# Patient Record
Sex: Female | Born: 1955
Health system: Southern US, Community
[De-identification: ages and names within clinical notes are randomized; demographics above are authoritative.]

## PROBLEM LIST (undated history)

## (undated) DIAGNOSIS — M2550 Pain in unspecified joint: Secondary | ICD-10-CM

## (undated) DIAGNOSIS — E739 Lactose intolerance, unspecified: Secondary | ICD-10-CM

## (undated) DIAGNOSIS — E7800 Pure hypercholesterolemia, unspecified: Secondary | ICD-10-CM

## (undated) DIAGNOSIS — R0602 Shortness of breath: Secondary | ICD-10-CM

## (undated) DIAGNOSIS — G43909 Migraine, unspecified, not intractable, without status migrainosus: Secondary | ICD-10-CM

## (undated) DIAGNOSIS — N3281 Overactive bladder: Secondary | ICD-10-CM

## (undated) DIAGNOSIS — T7840XA Allergy, unspecified, initial encounter: Secondary | ICD-10-CM

## (undated) DIAGNOSIS — R3129 Other microscopic hematuria: Secondary | ICD-10-CM

## (undated) HISTORY — DX: Migraine, unspecified, not intractable, without status migrainosus: G43.909

## (undated) HISTORY — DX: Shortness of breath: R06.02

## (undated) HISTORY — PX: EYE SURGERY: SHX253

## (undated) HISTORY — DX: Lactose intolerance, unspecified: E73.9

## (undated) HISTORY — DX: Pure hypercholesterolemia, unspecified: E78.00

## (undated) HISTORY — DX: Other microscopic hematuria: R31.29

## (undated) HISTORY — DX: Pain in unspecified joint: M25.50

## (undated) HISTORY — DX: Allergy, unspecified, initial encounter: T78.40XA

## (undated) HISTORY — DX: Overactive bladder: N32.81

---

## 1999-02-06 ENCOUNTER — Other Ambulatory Visit: Admission: RE | Admit: 1999-02-06 | Discharge: 1999-02-06 | Payer: Self-pay | Admitting: Gynecology

## 2000-02-12 ENCOUNTER — Other Ambulatory Visit: Admission: RE | Admit: 2000-02-12 | Discharge: 2000-02-12 | Payer: Self-pay | Admitting: Gynecology

## 2001-04-05 ENCOUNTER — Other Ambulatory Visit: Admission: RE | Admit: 2001-04-05 | Discharge: 2001-04-05 | Payer: Self-pay | Admitting: Gynecology

## 2001-05-06 ENCOUNTER — Ambulatory Visit (HOSPITAL_COMMUNITY): Admission: RE | Admit: 2001-05-06 | Discharge: 2001-05-06 | Payer: Self-pay | Admitting: Gynecology

## 2002-03-22 ENCOUNTER — Other Ambulatory Visit: Admission: RE | Admit: 2002-03-22 | Discharge: 2002-03-22 | Payer: Self-pay | Admitting: Gynecology

## 2003-04-02 ENCOUNTER — Other Ambulatory Visit: Admission: RE | Admit: 2003-04-02 | Discharge: 2003-04-02 | Payer: Self-pay | Admitting: Gynecology

## 2004-05-19 ENCOUNTER — Other Ambulatory Visit: Admission: RE | Admit: 2004-05-19 | Discharge: 2004-05-19 | Payer: Self-pay | Admitting: Gynecology

## 2005-05-25 ENCOUNTER — Other Ambulatory Visit: Admission: RE | Admit: 2005-05-25 | Discharge: 2005-05-25 | Payer: Self-pay | Admitting: Obstetrics and Gynecology

## 2007-12-26 ENCOUNTER — Ambulatory Visit: Payer: Self-pay | Admitting: Internal Medicine

## 2008-04-17 ENCOUNTER — Ambulatory Visit: Payer: Self-pay | Admitting: Internal Medicine

## 2008-04-20 ENCOUNTER — Encounter: Admission: RE | Admit: 2008-04-20 | Discharge: 2008-04-20 | Payer: Self-pay | Admitting: Internal Medicine

## 2009-01-08 ENCOUNTER — Ambulatory Visit: Payer: Self-pay | Admitting: Internal Medicine

## 2009-04-02 ENCOUNTER — Ambulatory Visit: Payer: Self-pay | Admitting: Internal Medicine

## 2009-04-08 ENCOUNTER — Encounter: Admission: RE | Admit: 2009-04-08 | Discharge: 2009-04-08 | Payer: Self-pay | Admitting: Internal Medicine

## 2009-04-16 ENCOUNTER — Ambulatory Visit: Payer: Self-pay | Admitting: Internal Medicine

## 2009-10-22 ENCOUNTER — Ambulatory Visit: Payer: Self-pay | Admitting: Internal Medicine

## 2009-10-24 ENCOUNTER — Encounter: Admission: RE | Admit: 2009-10-24 | Discharge: 2009-10-24 | Payer: Self-pay | Admitting: Internal Medicine

## 2009-11-18 ENCOUNTER — Ambulatory Visit: Payer: Self-pay | Admitting: Internal Medicine

## 2010-03-23 ENCOUNTER — Encounter: Payer: Self-pay | Admitting: Internal Medicine

## 2010-07-18 NOTE — Op Note (Signed)
[facility]  Patient:    Brandi Berry, Brandi Berry Visit Number: [id number] MRN: [id number]          Service Type: DSU Location: [facility] Attending Physician:  Susa Raring Dictated by:   Luvenia Redden, M.D. Proc. Date: 05/06/01 Admit Date:  05/06/2001                             Operative Report  PREOPERATIVE DIAGNOSIS:       Abnormal uterine bleeding unresponsive to conservative therapy.  POSTOPERATIVE DIAGNOSES:      1. Abnormal uterine bleeding unresponsive to                                  conservative therapy.                               2. Endometrial polyps.  OPERATION:                    Hysteroscopy, dilation and curettage.  SURGEON:                      Luvenia Redden, M.D.  DESCRIPTION OF PROCEDURE:     Under good sedation, the patient was prepped and draped in a sterile manner.  The bladder was catheterized.  The uterus was retroflexed and upper limits of normal in size.  No palpable adnexal masses were present.  The cervix was grasped with a tenaculum and a paracervical block was performed using 10 cc of 1% lidocaine injected in the 4 and 8 oclock positions paracervically.  The uterus was then sounded to a depth of 9 cm.  The cervix was dilated.  A hysteroscope was placed in the endocervical canal.  Using Sorbitol as a distending medium, the canal was examined.  The scope was advanced into the uterine cavity.  In the uterine cavity were multiple polypoid excrescences from the endometrial lining.  There was one small synechia present in the midline going anterior to posteriorly.  The ostia were viewed and appeared normal.  The fundus appeared normal.  In the lining, most of the polyps were down toward the lower uterine segment anteriorly and posteriorly.  The scope was then retracted.  First, an endocervical curettage was done.  This was sent as a separate specimen.  The endometrial cavity was then explored with polyp forceps  and multiple fragments of tissue were obtained.  The endometrial cavity was then sharply curetted and a small to moderate amount of tissue was obtained.  The endometrial cavity was then wiped with a dry sponge and it was clean.  The scope was reinserted and the endometrial cavity was inspected.  The synechia that was present had been divided.  The endometrium had a shaggy, filmy appearance.  All of the polyps were gone.  The scope was then retracted and the tenaculum removed.  There was no excess bleeding.  Blood loss was estimated at 25 cc.  There was minimal fluid deficit.  The patient tolerated the procedure nicely and was removed to recovery in good condition. Dictated by:   Luvenia Redden, M.D. Attending Physician:  Susa Raring DD:  05/06/01 TD:  05/08/01 Job: [id number]

## 2010-09-11 ENCOUNTER — Other Ambulatory Visit: Payer: Self-pay | Admitting: Gynecology

## 2010-09-29 ENCOUNTER — Ambulatory Visit (INDEPENDENT_AMBULATORY_CARE_PROVIDER_SITE_OTHER): Payer: Self-pay | Admitting: Internal Medicine

## 2010-09-29 ENCOUNTER — Encounter: Payer: Self-pay | Admitting: Internal Medicine

## 2010-09-29 VITALS — BP 122/82 | HR 72 | Temp 98.0°F | Ht 65.0 in | Wt 182.0 lb

## 2010-09-29 DIAGNOSIS — G43909 Migraine, unspecified, not intractable, without status migrainosus: Secondary | ICD-10-CM | POA: Insufficient documentation

## 2010-09-29 NOTE — Progress Notes (Signed)
  Subjective:    Patient ID: Brandi Berry, female    DOB: 02-Jul-1955, 55 y.o.   MRN: [id number]  HPI patient has a history of migraine headaches. She used to be able to take Excedrin Migraine with some relief but that has not been available over-the-counter lately. Hasn't had a bad migraine in about 6 months. Developed a mild headache on Saturday, July 28. She failed one leftover Excedrin Migraine tablet in took it with some relief but continued to have a dull headache the rest of the day. The next day awakened with a headache and progressed to nausea. Try taking Demerol tablet I  had given her in the past which she says made headache worse. Still has headache today. She has tried Imitrex previously with no relief. Says Vicodin makes headache worse. I have prescribed Flexeril in the past but she said it really didn't help.    Review of Systems     Objective:   Physical Exam funduscopic exam is benign bilaterally. Cranial nerves II through XII are grossly intact. She is alert and oriented x3. Deep tendon reflexes 2+ and symmetrical in muscle strength is 5/5 in all groups tested. No focal deficits on brief neurological exam        Assessment & Plan:   Protracted migraine headache-treat with Sterapred DS 10 mg 6 day dosepak. Samples of Relpax and prescription with [facility] in one year refill. She should try Relpax within an hour of onset of headache. The problem is that sometimes she doesn't realize that headache is really a migraine and not just a tension headache. She should not wait too long to take action. First, she may try a Goody powder. If no relief within an hour, she is to try a Relpax 40 mg tablet. If no relief within a couple of hours, should try 20 mg prednisone orally. I have also given her a prescription today for Stadol nasal spray to use 1 spray in nostril should headache not improve despite these measures. Told her this could cause drowsiness. Hopefully we'll not make headache  worse as Vicodin and Demerol did. Says she's not having these headaches very frequently I don't think she is a candidate for Topamax. Also for nausea gave her Phenergan tablets 25 mg #30 one by mouth every 4 hours when necessary nausea. She complains this causes extreme drowsiness. If headaches do not improve, refer to neurologist

## 2011-02-02 ENCOUNTER — Telehealth: Payer: Self-pay | Admitting: Internal Medicine

## 2011-02-02 NOTE — Telephone Encounter (Signed)
Please have patient book visit.

## 2011-02-02 NOTE — Telephone Encounter (Signed)
Called patient and left voicemail that she needed to schedule an appt to be seen for her migraines per Dr. Lenord Fellers.

## 2011-03-10 ENCOUNTER — Encounter: Payer: Self-pay | Admitting: Internal Medicine

## 2011-03-10 ENCOUNTER — Ambulatory Visit (INDEPENDENT_AMBULATORY_CARE_PROVIDER_SITE_OTHER): Payer: 59 | Admitting: Internal Medicine

## 2011-03-10 VITALS — BP 112/78 | HR 60 | Temp 98.4°F | Wt 182.0 lb

## 2011-03-10 DIAGNOSIS — H6690 Otitis media, unspecified, unspecified ear: Secondary | ICD-10-CM

## 2011-03-10 DIAGNOSIS — H6693 Otitis media, unspecified, bilateral: Secondary | ICD-10-CM

## 2011-03-10 DIAGNOSIS — Z8669 Personal history of other diseases of the nervous system and sense organs: Secondary | ICD-10-CM

## 2011-03-10 DIAGNOSIS — J069 Acute upper respiratory infection, unspecified: Secondary | ICD-10-CM

## 2011-03-11 NOTE — Patient Instructions (Signed)
Take medications as directed for respiratory infection. For migraine headache, take triptan medication or over-the-counter medication immediately at onset of migraine. If no relief, may take 3 or 4--10 mg prednisone tablets one time only. Patient does not tolerate hydrocodone very well.

## 2011-03-11 NOTE — Progress Notes (Signed)
  Subjective:    Patient ID: Brandi Berry, female    DOB: 1955/06/16, 56 y.o.   MRN: [id number]  HPI 56 year old black female with history of migraine headaches in today with 2 week history of URI symptoms. Has had coughing congestion. Ears feel stopped up. No fever or shaking chills. No myalgias. Migraines have been better recently. Responds well to Sterapred but advised her she cannot take this on a regular basis. Doesn't tolerate hydrocodone well. Have given her triptan  medication which she says "helps some".    Review of Systems     Objective:   Physical Exam HEENT exam: Both TMs are full and pain bilaterally; pharynx is slightly injected without exudate; neck is supple without significant adenopathy; chest clear to auscultation.        Assessment & Plan:  Bilateral otitis media  URI  History of migraine headaches  Plan patient was given a Sterapred DS 10 mg 6 day dosepak of 21 tablets. If she gets a migraine headache that does not respond to tryptophan medication or over-the-counter medication, she may take 3 or 4 tablets of prednisone once.  Given Levaquin 500 milligrams daily for 10 days for URI and otitis media.

## 2011-05-19 ENCOUNTER — Encounter: Payer: Self-pay | Admitting: Internal Medicine

## 2011-05-19 ENCOUNTER — Ambulatory Visit (INDEPENDENT_AMBULATORY_CARE_PROVIDER_SITE_OTHER): Payer: 59 | Admitting: Internal Medicine

## 2011-05-19 VITALS — BP 116/80 | HR 80 | Temp 98.2°F | Wt 190.0 lb

## 2011-05-19 DIAGNOSIS — H9209 Otalgia, unspecified ear: Secondary | ICD-10-CM

## 2011-05-19 DIAGNOSIS — G43909 Migraine, unspecified, not intractable, without status migrainosus: Secondary | ICD-10-CM

## 2011-05-19 DIAGNOSIS — Z1211 Encounter for screening for malignant neoplasm of colon: Secondary | ICD-10-CM

## 2011-05-19 DIAGNOSIS — H9201 Otalgia, right ear: Secondary | ICD-10-CM

## 2011-05-19 NOTE — Progress Notes (Signed)
Addended by: Judy Pimple on: 05/19/2011 12:45 PM   Modules accepted: Orders

## 2011-05-19 NOTE — Progress Notes (Signed)
  Subjective:    Patient ID: Brandi Berry, female    DOB: March 30, 1955, 56 y.o.   MRN: [id number]  HPI patient says that husband and son had URI symptoms. She feels pain in her right year deep in her canal. Says that the pain is somewhat sharp. Says it radiates down into her neck and shoulder causing a headache. This was particularly true on Saturday, March 16. Has a history of migraine headaches. Has tried many remedies but not a lot works for her. We've tried Flexeril at bedtime as a preventative, Topamax she says made her headaches worse. Steroids work well to knock out the headache after it's been present for several hours or days. However she can't stay on that. Says that ear feels stopped up and she can't hear as well. We performed audiometry today and hearing was normal in the right ear at 40 dB and she only missed one tone 1000 Hz at 25 dB. Reviewed with her foods that would cause migraine headache. Generally when she awakens with a headache she tries to take 2 Excedrin migraine. Sometimes waits too long before trying to take action to treat the pain and wants up feeling nauseated. She does have Phenergan tablets to take for nausea. Doesn't have migraine as frequently as she use to. Has tried Relpax sample in the past and I gave her and it did work. Is asking for prescription for Relpax sample. Has never tried imipramine to prevent migraine. Is willing to try that. She is out of steroid dosepak.   Patient is pretty adamant that her right ear feels stopped up to some extent. She does sleep on her right side and says that she awakened on Saturday, March 16 with pain in right ear that extended into her neck and shoulder. I wonder if this is positional pain. Denies TMJ symptoms. Doesn't think she grinds her teeth at night.    Review of Systems     Objective:   Physical Exam both TMs look okay to me. They are not dull. There is a small amount of wax in the right external canal but it is not  blocking the external canal completely. Right external canal shows no lesions. Pharynx is clear. PERRLA extraocular movements are full.        Assessment & Plan:  Long discussion with patient some 25 minutes about how to manage migraine headaches and possible causes for ear pain. Prescribed Relpax tablets one months supply with refills. Gave her another Sterapred DS 10 mg 6 day dosepak. This is for prolonged protracted headache that does not resolve with Excedrin migraine or Relpax. Doesn't do well with pain medication such as Vicodin or butalbital. Try Elavil 10 mg at bedtime #30 with 2 refills as a prophylactic for migraine. Perhaps has some eustachian tube dysfunction. Had her try to blow with nostrils pH but she said that did not help clear her ear. Try Levaquin 500 milligrams daily for 7 days in case there is some middle ear infection. If ear symptoms do not resolve, we will refer to ENT physician for ear evaluation.  Diagnoses: right otalgia   Migraine headaches  Addendum: Patient wants to have screening colonoscopy in the near future.

## 2011-05-19 NOTE — Patient Instructions (Signed)
Take Levaquin for 7 days for ear congestion. Try Elavil 10 mg at bedtime to prevent migraine headaches. He had been prescribed Relpax tablets to take at onset of migraine. You have been prescribed 1 Sterapred DS 10 mg 6 day dosepak to take if you get into a cycle of prolonged headache.  Call call us back and request ENT referral if ear pain does not resolve.  We will try to schedule

## 2011-07-29 ENCOUNTER — Encounter: Payer: Self-pay | Admitting: Gastroenterology

## 2012-08-30 ENCOUNTER — Ambulatory Visit (INDEPENDENT_AMBULATORY_CARE_PROVIDER_SITE_OTHER): Payer: 59 | Admitting: Internal Medicine

## 2012-08-30 ENCOUNTER — Encounter: Payer: Self-pay | Admitting: Internal Medicine

## 2012-08-30 VITALS — BP 126/84 | HR 80 | Temp 97.8°F | Wt 193.0 lb

## 2012-08-30 DIAGNOSIS — Z8669 Personal history of other diseases of the nervous system and sense organs: Secondary | ICD-10-CM

## 2012-08-30 DIAGNOSIS — J019 Acute sinusitis, unspecified: Secondary | ICD-10-CM

## 2012-08-30 NOTE — Progress Notes (Signed)
  Subjective:    Patient ID: Brandi Berry, female    DOB: 1955/10/27, 57 y.o.   MRN: [id number]  HPI 57 year old Black female works 2 jobs. Long-standing history of migraine headaches. Currently having 3 or 4 migraines a week. Sometimes takes Excedrin and waits for headache to go away. Sometimes gets into a full-blown migraine and doesn't seem to recognize it before its to leg. Recently I gave her some Relpax to try. It does help some but of concern it may be contributing to recurrent headaches. She says she's had sinus issues recently. Has had nasal stuffiness. Not able to get much nasal drainage and out. Tenderness in maxillary sinuses.  Have reviewed with her previously foods that can cause headaches. She's been under some stress because husband had knee replacement surgery, wound up with a fractured patella after a fall and was hospitalized with Dilantin toxicity from a rehabilitation facility.  Have spoken with her about how to manage headaches in the past but I think she needs additional evaluation and help.    Review of Systems     Objective:   Physical Exam fundi benign. TMs are clear. Pharynx is clear. Neck is supple without thyromegaly JVD or carotid bruits. Chest clear. Cardiac exam regular rate and rhythm normal S1 and S2. Neurological exam: Cranial nerves II through XII intact. No facial asymmetry. Extraocular movements are full. PERRLA. Muscle strength is normal. Deep tendon reflexes 2+ and symmetrical. Alert and oriented x3.        Assessment & Plan:  Frequent migraine headaches could be exacerbated by frequent use of triptan  medication.  Sinusitis  Plan: Sterapred DS 10 mg 6 day dosepak to help get her out of headache cycle. Zomig 5 mg nasal spray to use for onset of severe migraine. Appointment with Dr. Neale Burly regarding headaches. Levaquin 500 milligrams daily for 7 days for sinusitis.

## 2012-09-03 NOTE — Patient Instructions (Addendum)
Take antibiotics as directed for sinusitis. Take Sterapred DS 10 mg 6 day dosepak to get out of headache cycle. Appointment will be made for you to see Dr. Neale Burly

## 2013-05-25 ENCOUNTER — Encounter: Payer: Self-pay | Admitting: Internal Medicine

## 2013-05-25 ENCOUNTER — Ambulatory Visit (INDEPENDENT_AMBULATORY_CARE_PROVIDER_SITE_OTHER): Payer: 59 | Admitting: Internal Medicine

## 2013-05-25 VITALS — BP 118/82 | HR 84 | Temp 98.4°F | Wt 193.0 lb

## 2013-05-25 DIAGNOSIS — G43909 Migraine, unspecified, not intractable, without status migrainosus: Secondary | ICD-10-CM

## 2013-05-25 DIAGNOSIS — H6590 Unspecified nonsuppurative otitis media, unspecified ear: Secondary | ICD-10-CM

## 2013-05-25 DIAGNOSIS — H6593 Unspecified nonsuppurative otitis media, bilateral: Secondary | ICD-10-CM

## 2013-05-25 DIAGNOSIS — J029 Acute pharyngitis, unspecified: Secondary | ICD-10-CM

## 2013-05-25 DIAGNOSIS — J069 Acute upper respiratory infection, unspecified: Secondary | ICD-10-CM

## 2013-05-25 LAB — POCT RAPID STREP A (OFFICE): RAPID STREP A SCREEN: NEGATIVE

## 2013-05-25 MED ORDER — LEVOFLOXACIN 500 MG PO TABS: 500.0000 mg | ORAL_TABLET | Freq: Every day | ORAL | Status: DC

## 2013-05-25 MED ORDER — BUTALBITAL-APAP-CAFFEINE 50-325-40 MG PO TABS
ORAL_TABLET | ORAL | Status: DC
Start: 2013-05-25 — End: 2015-02-01

## 2013-05-25 MED ORDER — BENZONATATE 100 MG PO CAPS: 200.0000 mg | ORAL_CAPSULE | Freq: Three times a day (TID) | ORAL | Status: DC

## 2013-05-25 MED ORDER — PREDNISONE 10 MG PO KIT: PACK | ORAL | Status: DC

## 2013-05-25 NOTE — Addendum Note (Signed)
Addended by: Brett Canales on: 05/25/2013 11:26 AM   Modules accepted: Orders

## 2013-05-25 NOTE — Progress Notes (Signed)
   Subjective:    Patient ID: Brandi Berry, female    DOB: [birth year], 58 y.o.   MRN: [id number]  HPI  In today with URI symptoms. Has had cough and congestion. Some discolored nasal drainage. Has had migraine headache as well. No fever or shaking chills. Some sore throat. [facility] for recurrent migraine headaches. Initially it seemed to help but more recently when dose was increased she said it did not. She needs to go back and see Dr. Domingo Cocking once again.    Review of Systems     Objective:   Physical Exam rapid strep screen is negative. Pharynx very slightly injected. TMs are dull bilaterally but not red. Neck is supple. Chest clear.        Assessment & Plan:  Migraine headache  Acute upper respiratory infection  Bilateral serous otitis media  Plan: Sterapred DS 10 mg 6 day dosepak take as directed in tapering course over 6 days. Levaquin 500 milligrams daily for 10 days. Fioricet 1 or 2 by mouth Q8 hours when necessary headache not to exceed 6 tablets in 24 hours. She cannot tolerate hydrocodone. Tessalon Perles 200 mg 3 times a day when necessary cough.

## 2013-05-25 NOTE — Patient Instructions (Signed)
Take Fioricet as needed for headache. Take Levaquin 500 milligrams daily for respiratory infection symptoms including ear infection. Tessalon Perles as needed for cough. Prednisone taper as needed for congestion and headache.

## 2013-06-22 ENCOUNTER — Telehealth: Payer: Self-pay | Admitting: Internal Medicine

## 2013-06-22 NOTE — Telephone Encounter (Signed)
Noted  

## 2013-06-23 ENCOUNTER — Ambulatory Visit (INDEPENDENT_AMBULATORY_CARE_PROVIDER_SITE_OTHER): Payer: 59 | Admitting: Internal Medicine

## 2013-06-23 ENCOUNTER — Encounter: Payer: Self-pay | Admitting: Internal Medicine

## 2013-06-23 VITALS — BP 124/82 | HR 72 | Temp 98.2°F | Wt 193.0 lb

## 2013-06-23 DIAGNOSIS — J309 Allergic rhinitis, unspecified: Secondary | ICD-10-CM

## 2013-06-23 DIAGNOSIS — L299 Pruritus, unspecified: Secondary | ICD-10-CM

## 2013-06-23 MED ORDER — PREDNISONE 10 MG PO TABS: 10.0000 mg | ORAL_TABLET | Freq: Every day | ORAL | Status: DC

## 2013-06-23 MED ORDER — AZITHROMYCIN 250 MG PO TABS: ORAL_TABLET | ORAL | Status: DC

## 2013-06-23 NOTE — Progress Notes (Signed)
   Subjective:    Patient ID: Brandi Berry, female    DOB: 10/29/1955, 58 y.o.   MRN: [id number]  HPI Patient was seen March 26 for respiratory infection and migraine headache treated with Levaquin, six-day taper her prednisone, Fioricet for migraine headache. She says she is no better. Says that her ears feel stopped up, her head feels full, and she feels congested. She tried Flonase nasal spray but she says I gave her a headache. She does not remember taking prednisone taper just the antibiotic. Does not have headache today.    Review of Systems     Objective:   Physical Exam both TMs are dull bilaterally. Pharynx is clear. Neck is supple. Chest clear.        Assessment & Plan:  Allergic rhinitis  Plan: Levaquin 500 milligrams daily for 7 days. Sterapred DS 10 mg 12 day dosepak take as directed. Tells me that she takes Zyrtec on a daily basis for skin itching.  Addendum: Pharmacy confirms that they did not get prescription for six-day Sterapred DS pack on March 26.

## 2013-06-23 NOTE — Patient Instructions (Signed)
Take Zithromax Z-PAK as directed and prednisone 12 day taper as directed.

## 2013-07-08 ENCOUNTER — Ambulatory Visit: Payer: 59

## 2013-07-08 ENCOUNTER — Other Ambulatory Visit: Payer: Self-pay | Admitting: Family Medicine

## 2013-07-08 ENCOUNTER — Ambulatory Visit (INDEPENDENT_AMBULATORY_CARE_PROVIDER_SITE_OTHER): Payer: 59 | Admitting: Family Medicine

## 2013-07-08 VITALS — BP 116/64 | HR 64 | Temp 98.4°F | Resp 12 | Ht 63.4 in | Wt 185.4 lb

## 2013-07-08 DIAGNOSIS — M79672 Pain in left foot: Secondary | ICD-10-CM

## 2013-07-08 DIAGNOSIS — M79609 Pain in unspecified limb: Secondary | ICD-10-CM

## 2013-07-08 DIAGNOSIS — M722 Plantar fascial fibromatosis: Secondary | ICD-10-CM

## 2013-07-08 DIAGNOSIS — M7742 Metatarsalgia, left foot: Secondary | ICD-10-CM

## 2013-07-08 MED ORDER — MELOXICAM 15 MG PO TABS: 15.0000 mg | ORAL_TABLET | Freq: Every day | ORAL | Status: DC

## 2013-07-08 MED ORDER — TRAMADOL HCL 50 MG PO TABS: 50.0000 mg | ORAL_TABLET | Freq: Four times a day (QID) | ORAL | Status: DC | PRN

## 2013-07-08 NOTE — Patient Instructions (Signed)
Plantar Fasciitis (Heel Spur Syndrome)  with Rehab  The plantar fascia is a fibrous, ligament-like, soft-tissue structure that spans the bottom of the foot. Plantar fasciitis is a condition that causes pain in the foot due to inflammation of the tissue.  SYMPTOMS   · Pain and tenderness on the underneath side of the foot.  · Pain that worsens with standing or walking.  CAUSES   Plantar fasciitis is caused by irritation and injury to the plantar fascia on the underneath side of the foot. Common mechanisms of injury include:  · Direct trauma to bottom of the foot.  · Damage to a small nerve that runs under the foot where the main fascia attaches to the heel bone.  · Stress placed on the plantar fascia due to bone spurs.  RISK INCREASES WITH:   · Activities that place stress on the plantar fascia (running, jumping, pivoting, or cutting).  · Poor strength and flexibility.  · Improperly fitted shoes.  · Tight calf muscles.  · Flat feet.  · Failure to warm-up properly before activity.  · Obesity.  PREVENTION  · Warm up and stretch properly before activity.  · Allow for adequate recovery between workouts.  · Maintain physical fitness:  · Strength, flexibility, and endurance.  · Cardiovascular fitness.  · Maintain a health body weight.  · Avoid stress on the plantar fascia.  · Wear properly fitted shoes, including arch supports for individuals who have flat feet.  PROGNOSIS   If treated properly, then the symptoms of plantar fasciitis usually resolve without surgery. However, occasionally surgery is necessary.  RELATED COMPLICATIONS   · Recurrent symptoms that may result in a chronic condition.  · Problems of the lower back that are caused by compensating for the injury, such as limping.  · Pain or weakness of the foot during push-off following surgery.  · Chronic inflammation, scarring, and partial or complete fascia tear, occurring more often from repeated injections.  TREATMENT   Treatment initially involves the use of  ice and medication to help reduce pain and inflammation. The use of strengthening and stretching exercises may help reduce pain with activity, especially stretches of the Achilles tendon. These exercises may be performed at home or with a therapist. Your caregiver may recommend that you use heel cups of arch supports to help reduce stress on the plantar fascia. Occasionally, corticosteroid injections are given to reduce inflammation. If symptoms persist for greater than 6 months despite non-surgical (conservative), then surgery may be recommended.   MEDICATION   · If pain medication is necessary, then nonsteroidal anti-inflammatory medications, such as aspirin and ibuprofen, or other minor pain relievers, such as acetaminophen, are often recommended.  · Do not take pain medication within 7 days before surgery.  · Prescription pain relievers may be given if deemed necessary by your caregiver. Use only as directed and only as much as you need.  · Corticosteroid injections may be given by your caregiver. These injections should be reserved for the most serious cases, because they may only be given a certain number of times.  HEAT AND COLD  · Cold treatment (icing) relieves pain and reduces inflammation. Cold treatment should be applied for 10 to 15 minutes every 2 to 3 hours for inflammation and pain and immediately after any activity that aggravates your symptoms. Use ice packs or massage the area with a piece of ice (ice massage).  · Heat treatment may be used prior to performing the stretching and strengthening activities prescribed   by your caregiver, physical therapist, or athletic trainer. Use a heat pack or soak the injury in warm water.  SEEK IMMEDIATE MEDICAL CARE IF:  · Treatment seems to offer no benefit, or the condition worsens.  · Any medications produce adverse side effects.  EXERCISES  RANGE OF MOTION (ROM) AND STRETCHING EXERCISES - Plantar Fasciitis (Heel Spur Syndrome)  These exercises may help you  when beginning to rehabilitate your injury. Your symptoms may resolve with or without further involvement from your physician, physical therapist or athletic trainer. While completing these exercises, remember:   · Restoring tissue flexibility helps normal motion to return to the joints. This allows healthier, less painful movement and activity.  · An effective stretch should be held for at least 30 seconds.  · A stretch should never be painful. You should only feel a gentle lengthening or release in the stretched tissue.  RANGE OF MOTION - Toe Extension, Flexion  · Sit with your right / left leg crossed over your opposite knee.  · Grasp your toes and gently pull them back toward the top of your foot. You should feel a stretch on the bottom of your toes and/or foot.  · Hold this stretch for __________ seconds.  · Now, gently pull your toes toward the bottom of your foot. You should feel a stretch on the top of your toes and or foot.  · Hold this stretch for __________ seconds.  Repeat __________ times. Complete this stretch __________ times per day.   RANGE OF MOTION - Ankle Dorsiflexion, Active Assisted  · Remove shoes and sit on a chair that is preferably not on a carpeted surface.  · Place right / left foot under knee. Extend your opposite leg for support.  · Keeping your heel down, slide your right / left foot back toward the chair until you feel a stretch at your ankle or calf. If you do not feel a stretch, slide your bottom forward to the edge of the chair, while still keeping your heel down.  · Hold this stretch for __________ seconds.  Repeat __________ times. Complete this stretch __________ times per day.   STRETCH  Gastroc, Standing  · Place hands on wall.  · Extend right / left leg, keeping the front knee somewhat bent.  · Slightly point your toes inward on your back foot.  · Keeping your right / left heel on the floor and your knee straight, shift your weight toward the wall, not allowing your back to  arch.  · You should feel a gentle stretch in the right / left calf. Hold this position for __________ seconds.  Repeat __________ times. Complete this stretch __________ times per day.  STRETCH  Soleus, Standing  · Place hands on wall.  · Extend right / left leg, keeping the other knee somewhat bent.  · Slightly point your toes inward on your back foot.  · Keep your right / left heel on the floor, bend your back knee, and slightly shift your weight over the back leg so that you feel a gentle stretch deep in your back calf.  · Hold this position for __________ seconds.  Repeat __________ times. Complete this stretch __________ times per day.  STRETCH  Gastrocsoleus, Standing   Note: This exercise can place a lot of stress on your foot and ankle. Please complete this exercise only if specifically instructed by your caregiver.   · Place the ball of your right / left foot on a step, keeping   your other foot firmly on the same step.  · Hold on to the wall or a rail for balance.  · Slowly lift your other foot, allowing your body weight to press your heel down over the edge of the step.  · You should feel a stretch in your right / left calf.  · Hold this position for __________ seconds.  · Repeat this exercise with a slight bend in your right / left knee.  Repeat __________ times. Complete this stretch __________ times per day.   STRENGTHENING EXERCISES - Plantar Fasciitis (Heel Spur Syndrome)   These exercises may help you when beginning to rehabilitate your injury. They may resolve your symptoms with or without further involvement from your physician, physical therapist or athletic trainer. While completing these exercises, remember:   · Muscles can gain both the endurance and the strength needed for everyday activities through controlled exercises.  · Complete these exercises as instructed by your physician, physical therapist or athletic trainer. Progress the resistance and repetitions only as guided.  STRENGTH - Towel  Curls  · Sit in a chair positioned on a non-carpeted surface.  · Place your foot on a towel, keeping your heel on the floor.  · Pull the towel toward your heel by only curling your toes. Keep your heel on the floor.  · If instructed by your physician, physical therapist or athletic trainer, add ____________________ at the end of the towel.  Repeat __________ times. Complete this exercise __________ times per day.  STRENGTH - Ankle Inversion  · Secure one end of a rubber exercise band/tubing to a fixed object (table, pole). Loop the other end around your foot just before your toes.  · Place your fists between your knees. This will focus your strengthening at your ankle.  · Slowly, pull your big toe up and in, making sure the band/tubing is positioned to resist the entire motion.  · Hold this position for __________ seconds.  · Have your muscles resist the band/tubing as it slowly pulls your foot back to the starting position.  Repeat __________ times. Complete this exercises __________ times per day.   Document Released: 02/16/2005 Document Revised: 05/11/2011 Document Reviewed: 05/31/2008  [employer]® Patient Information ©2014 [employer].

## 2013-07-08 NOTE — Progress Notes (Addendum)
Subjective:    Patient ID: Brandi Berry, female    DOB: Sep 16, 1955, 58 y.o.   MRN: [id number]  07/08/2013  Foot Pain  This chart was scribed for Reginia Forts, MD by Erling Conte, ED Scribe. This patient was seen in Room 14 and the patient's care was started at 11:24 AM.  HPI HPI Comments: Brandi Berry is a 58 y.o. female who presents to the [facility] complaining of intermittent left foot pain onset for a week. Patient states yesterday the pain became severe. Patient reports that the pain radiates from her ankle to the base of foot. Patient denies any injury that she knows of. Patient states that she has some associated heal pain, muscle tightness, and numbness along distal foot. Patient states that the pain is relieved with rest but reports that any kind of movement or pressure on the foot makes it worse. Patient states that she has tried Excedrin and applied heat with no relief. Patient has a history of Plantar Fascitis and was previously seen at [facility] for this problem. Patient denies any swelling.   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  Review of Systems  Constitutional: Negative for fever, chills, diaphoresis and fatigue.  Musculoskeletal: Positive for arthralgias and myalgias. Negative for joint swelling.       Left foot and heal pain  Skin: Negative for color change and rash.  Neurological: Positive for numbness (in foot).    Past Medical History  Diagnosis Date   Urinary incontinence    Microscopic hematuria    Allergy    Allergies  Allergen Reactions   Penicillins    Vicodin [Hydrocodone-Acetaminophen]    Current Outpatient Prescriptions  Medication Sig Dispense Refill   aspirin-acetaminophen-caffeine (EXCEDRIN MIGRAINE) 250-250-65 MG per tablet Take by mouth every 6 (six) hours as needed  for headache.       azithromycin (ZITHROMAX) 250 MG tablet 2 tablets day one followed by 1 tablet days 2 through 5  6 tablet  0   benzonatate (TESSALON) 100 MG capsule Take 2 capsules (200 mg total) by mouth 3 (three) times daily.  60 capsule  0   butalbital-acetaminophen-caffeine (FIORICET) 50-325-40 MG per tablet 1-2 tabs po q 8 hour prn headache not to exceed 6 tabs in 24 hours  40 tablet  0   cetirizine (ZYRTEC) 10 MG tablet Take 10 mg by mouth daily.         Fesoterodine Fumarate (TOVIAZ) 8 MG TB24 Take by mouth daily.         Multiple Vitamin (MULTIVITAMIN) capsule Take 1 capsule by mouth daily.         predniSONE (DELTASONE) 10 MG tablet Take 1 tablet (10 mg total) by mouth daily with breakfast. Taken tapering dose as directed-6-6-5-5-4-4-3-3-2-2-1-1 taper for allergic rhinitis  42 tablet  0   meloxicam (MOBIC) 15 MG tablet Take 1 tablet (15 mg total) by mouth daily.  30 tablet  1   traMADol (ULTRAM) 50 MG tablet Take 1-2 tablets (50-100 mg total) by mouth every 6 (six) hours as needed.  60 tablet  0   No current facility-administered medications for this visit.   History   Social History   Marital Status: Married    Spouse Name: N/A    Number of Children: N/A  Years of Education: N/A   Occupational History   Not on file.   Social History Main Topics   Smoking status: Former Smoker    Quit date: 03/02/1993   Smokeless tobacco: Not on file   Alcohol Use: 2.0 oz/week    4 drink(s) per week   Drug Use: Not on file   Sexual Activity: Not on file   Other Topics Concern   Not on file   Social History Narrative   No narrative on file       Objective:    BP 116/64   Pulse 64   Temp(Src) 98.4 F (36.9 C) (Oral)   Resp 12   Ht 5' 3.4" (1.61 m)   Wt 185 lb 6.4 oz (84.097 kg)   BMI 32.44 kg/m2   SpO2 96%  Physical Exam  Nursing note and vitals reviewed. Constitutional: She is oriented to person, place, and time. She appears well-developed and  well-nourished. No distress.  HENT:  Head: Normocephalic and atraumatic.  Eyes: EOM are normal.  Neck: Neck supple. No tracheal deviation present.  Pulmonary/Chest: Effort normal. No respiratory distress.  Musculoskeletal: Normal range of motion.       Right ankle: She exhibits normal range of motion, no swelling, no ecchymosis and no deformity. Tenderness. Lateral malleolus tenderness found. No medial malleolus and no head of 5th metatarsal tenderness found. Achilles tendon normal.       Left ankle: She exhibits normal range of motion, no swelling and no ecchymosis. Tenderness. Lateral malleolus tenderness found. No medial malleolus and no proximal fibula tenderness found. Achilles tendon normal.       Right foot: She exhibits normal range of motion, no tenderness, no bony tenderness, no swelling, normal capillary refill, no crepitus, no deformity and no laceration.       Left foot: She exhibits tenderness and bony tenderness. She exhibits normal range of motion, no swelling, normal capillary refill, no crepitus and no deformity.  Tender to palpation along heal at insertion of plantar fascia. Mild tenderness to palpation along 2nd and 3rd metatarsals distally. Non-tender with metatarsal squeeze.    Neurological: She is alert and oriented to person, place, and time.  Skin: Skin is warm and dry. No rash noted. She is not diaphoretic. No erythema.  Psychiatric: She has a normal mood and affect. Her behavior is normal.   [facility] reading (PRIMARY) by  Dr. Tamala Julian.  L FOOT:  NAD       Assessment & Plan:  Pain in left foot - Plan: DG Foot Complete Left  Pain of left heel - Plan: DG Foot Complete Left  Plantar fasciitis   1.  L Foot Pain: New. Secondary to plantar fasciitis and concern for Morton's neuroma versus metatarsalgia.  Rx for Tramadol provided. 2.  Plantar Fasciitis L foot:  New.  Rx for Meloxicam daily; ice foot qhs; heel cups. Avoid flip flops and walking barefooted; recommend good  supportive shoe and to avoid prolonged standing; if no improvement in one month, recommend podiatry consultation. 3.  Metatarsalgia L foot: New. Versus Morton's neuroma; Mobic, supportive shoe, icing.  If no improvement one month, podiatry referral.  Meds ordered this encounter  Medications   meloxicam (MOBIC) 15 MG tablet    Sig: Take 1 tablet (15 mg total) by mouth daily.    Dispense:  30 tablet    Refill:  1   traMADol (ULTRAM) 50 MG tablet    Sig: Take 1-2 tablets (50-100 mg total) by mouth every 6 (  six) hours as needed.    Dispense:  60 tablet    Refill:  0    No Follow-up on file.   I personally performed the services described in this documentation, which was scribed in my presence.  The recorded information has been reviewed and is accurate.  Reginia Forts, M.D.  [facility] [address] [place], [place]  [address] [phone] phone [phone] fax

## 2013-11-13 ENCOUNTER — Ambulatory Visit (INDEPENDENT_AMBULATORY_CARE_PROVIDER_SITE_OTHER): Payer: 59 | Admitting: Internal Medicine

## 2013-11-13 VITALS — BP 118/70 | HR 80

## 2013-11-13 DIAGNOSIS — H6500 Acute serous otitis media, unspecified ear: Secondary | ICD-10-CM

## 2013-11-13 DIAGNOSIS — H6501 Acute serous otitis media, right ear: Secondary | ICD-10-CM

## 2013-11-13 DIAGNOSIS — G43011 Migraine without aura, intractable, with status migrainosus: Secondary | ICD-10-CM

## 2013-11-13 MED ORDER — PREDNISONE 10 MG PO TABS: ORAL_TABLET | ORAL | Status: DC

## 2013-11-13 MED ORDER — AZITHROMYCIN 250 MG PO TABS
ORAL_TABLET | ORAL | Status: DC
Start: 2013-11-13 — End: 2014-07-19

## 2014-01-21 ENCOUNTER — Encounter: Payer: Self-pay | Admitting: Internal Medicine

## 2014-01-21 NOTE — Progress Notes (Signed)
   Subjective:    Patient ID: Brandi Berry, female    DOB: 12/20/1955, 58 y.o.   MRN: [id number]  HPI  1 week history of right ear pain and right periorbital headache. No nausea and vomiting. No fever. Declines flu vaccine. Not able to get any pain relief. She is afebrile today. Annoyed that headache will not go away. No nausea and vomiting. Has a history of recurrent frequent headaches suggestive of migraine.    Review of Systems     Objective:   Physical Exam  Right TM is full but not red. Left TM clear. Pharynx is clear. Funduscopic exam reveals suggest be sharp and flat bilaterally. Extraocular movements are full. No nystagmus. PERRLA. Moves all 4 extremities. Muscle strength in the upper and lower extremities are normal.      Assessment & Plan:  Migraine headache  Right serous otitis media  Plan: Sterapred DS 10 mg 6 day dosepak. Demerol 50 mg 1 by mouth every 6 hours when necessary headache. Zithromax Z-PAK as directed.

## 2014-01-21 NOTE — Patient Instructions (Addendum)
Take Demerol for headache. Take prednisone as directed in tapering course. Take antibiotics as directed. Call if not better in 48 hours or sooner if worse

## 2014-03-14 ENCOUNTER — Telehealth: Payer: Self-pay | Admitting: *Deleted

## 2014-03-14 DIAGNOSIS — Z0000 Encounter for general adult medical examination without abnormal findings: Secondary | ICD-10-CM

## 2014-03-14 NOTE — Telephone Encounter (Signed)
Patient requesting referral to have colonoscopy done per Dr Renold Genta refer to [facility] order placed

## 2014-05-02 ENCOUNTER — Telehealth: Payer: Self-pay | Admitting: *Deleted

## 2014-05-02 DIAGNOSIS — Z0000 Encounter for general adult medical examination without abnormal findings: Secondary | ICD-10-CM

## 2014-05-02 NOTE — Telephone Encounter (Signed)
Spoke with [facility] they will schedule patient for colonscopy

## 2014-05-30 ENCOUNTER — Encounter: Payer: Self-pay | Admitting: Gastroenterology

## 2014-06-04 ENCOUNTER — Encounter: Payer: Self-pay | Admitting: Internal Medicine

## 2014-07-19 ENCOUNTER — Ambulatory Visit (INDEPENDENT_AMBULATORY_CARE_PROVIDER_SITE_OTHER): Payer: 59 | Admitting: Internal Medicine

## 2014-07-19 ENCOUNTER — Encounter: Payer: Self-pay | Admitting: Internal Medicine

## 2014-07-19 VITALS — BP 112/78 | HR 68 | Temp 97.9°F | Ht 63.0 in | Wt 196.0 lb

## 2014-07-19 DIAGNOSIS — G43011 Migraine without aura, intractable, with status migrainosus: Secondary | ICD-10-CM

## 2014-07-19 DIAGNOSIS — J0100 Acute maxillary sinusitis, unspecified: Secondary | ICD-10-CM | POA: Diagnosis not present

## 2014-07-19 MED ORDER — PREDNISONE 10 MG (21) PO TBPK: 10.0000 mg | ORAL_TABLET | Freq: Every day | ORAL | Status: DC

## 2014-07-19 MED ORDER — ELETRIPTAN HYDROBROMIDE 40 MG PO TABS: 40.0000 mg | ORAL_TABLET | ORAL | Status: DC | PRN

## 2014-07-19 MED ORDER — MEPERIDINE HCL 50 MG PO TABS: 50.0000 mg | ORAL_TABLET | ORAL | Status: DC | PRN

## 2014-07-19 MED ORDER — LEVOFLOXACIN 500 MG PO TABS: 500.0000 mg | ORAL_TABLET | Freq: Every day | ORAL | Status: DC

## 2014-07-19 NOTE — Patient Instructions (Signed)
Take Relpax at onset of migraine headache. Take Demerol sparingly to control severe headache. Take Levaquin for 10 days for sinusitis. Take Sterapred DS 10 mg 6 day dosepak for migraine headache and sinusitis.

## 2014-07-20 NOTE — Progress Notes (Signed)
   Subjective:    Patient ID: Brandi Berry, female    DOB: 03-Sep-1955, 59 y.o.   MRN: [id number]  HPI  History of migraine headaches. Has had headache right side of face for several days. Not able to tolerate hydrocodone, Fioricet, tramadol. She says they'll aggravate or cause a headache. Realizes that she needs to take triptan medication such as Relpax at onset of migraine to get immediate relief but sometimes waits too long. Also has had ear popping and some sinus congestion. Thinks that she probably has sinus infection as well.    Review of Systems     Objective:   Physical Exam  TMs are slightly dull bilaterally but not red. Pharynx is clear. Neck is supple. Chest clear. No palpable tenderness along temple or right maxillary sinus area.      Assessment & Plan:  Protracted migraine headache  Possible sinusitis  Plan: Responds well to steroids. Sterapred DS 10 mg 6 day dosepak prescribed. Levaquin 500 milligrams daily for 10 days. Prescription for Relpax 40 mg to take at onset of migraine headache. For pain relief today gave her prescription to try for Demerol 50 mg one by mouth every 6-8 hours when necessary pain.

## 2014-08-24 ENCOUNTER — Encounter: Payer: Self-pay | Admitting: Internal Medicine

## 2015-01-21 ENCOUNTER — Ambulatory Visit (INDEPENDENT_AMBULATORY_CARE_PROVIDER_SITE_OTHER): Payer: 59 | Admitting: Internal Medicine

## 2015-01-21 ENCOUNTER — Encounter: Payer: Self-pay | Admitting: Internal Medicine

## 2015-01-21 VITALS — BP 126/78 | HR 88 | Temp 97.6°F | Resp 20 | Wt 196.0 lb

## 2015-01-21 DIAGNOSIS — J069 Acute upper respiratory infection, unspecified: Secondary | ICD-10-CM

## 2015-01-21 DIAGNOSIS — Z8669 Personal history of other diseases of the nervous system and sense organs: Secondary | ICD-10-CM | POA: Diagnosis not present

## 2015-01-21 MED ORDER — AZITHROMYCIN 250 MG PO TABS: ORAL_TABLET | ORAL | Status: DC

## 2015-02-01 ENCOUNTER — Ambulatory Visit (INDEPENDENT_AMBULATORY_CARE_PROVIDER_SITE_OTHER): Payer: 59 | Admitting: Internal Medicine

## 2015-02-01 ENCOUNTER — Encounter: Payer: Self-pay | Admitting: Internal Medicine

## 2015-02-01 VITALS — BP 134/90 | HR 67 | Temp 97.0°F | Resp 20 | Ht 63.0 in | Wt 194.0 lb

## 2015-02-01 DIAGNOSIS — G43919 Migraine, unspecified, intractable, without status migrainosus: Secondary | ICD-10-CM

## 2015-02-01 DIAGNOSIS — H6503 Acute serous otitis media, bilateral: Secondary | ICD-10-CM | POA: Diagnosis not present

## 2015-02-01 MED ORDER — PREDNISONE 10 MG (21) PO TBPK: 10.0000 mg | ORAL_TABLET | Freq: Every day | ORAL | Status: DC

## 2015-02-01 MED ORDER — LEVOFLOXACIN 500 MG PO TABS: 500.0000 mg | ORAL_TABLET | Freq: Every day | ORAL | Status: DC

## 2015-02-01 NOTE — Patient Instructions (Signed)
Take prednisone in tapering course as directed and Levaquin 500 milligrams daily for 10 days. Rest and drink plenty of fluids.

## 2015-02-01 NOTE — Progress Notes (Signed)
   Subjective:    Patient ID: Brandi Berry, female    DOB: 1955-08-31, 59 y.o.   MRN: [id number]  HPI Patient returns saying she is no better with respiratory congestion and ear congestion. Her sister-in-law was staying with her as her husband passed away. This is been stressful. Patient not getting much sleep. Patient has migraine headache as well. Patient has been taking vitamin B supplement she read about in the newspaper to prevent migraine headaches. May want to try feverfew.    Review of Systems     Objective:   Physical Exam TMs are dull and full bilaterally. Pharynx clear. Neck is supple. Chest clear to auscultation. No focal deficits on brief neurological exam.       Assessment & Plan:  Bilateral serous otitis media  Migraine headache  Plan: Sterapred DS 10 mg 6 day dosepak. Levaquin 500 milligrams daily for 10 days.

## 2015-04-24 NOTE — Patient Instructions (Addendum)
Take Zithromax Z-PAK as directed.  Rest and drink plenty of fluids. 

## 2015-04-24 NOTE — Progress Notes (Signed)
   Subjective:    Patient ID: Brandi Berry, female    DOB: [birth year], 60 y.o.   MRN: [id number]  HPI 60 year old Black Female in today with headache, sore throat and cough.has malaise and fatigue. No fever or shaking chills. History of migraine headaches.    Review of Systems  as above     Objective:   Physical Exam  Skin warm and dry. Nodes none. Pharynx injected. TMs are slightly full bilaterally. Neck is supple. Chest: clear to auscultation without rales or wheezing      Assessment & Plan:  Acute URI  Plan:Zithromax Z-PAK take 2 tablets day one followed by 1 tablet days 2 through 5.

## 2015-05-06 ENCOUNTER — Ambulatory Visit (INDEPENDENT_AMBULATORY_CARE_PROVIDER_SITE_OTHER): Payer: 59 | Admitting: Internal Medicine

## 2015-05-06 ENCOUNTER — Encounter: Payer: Self-pay | Admitting: Internal Medicine

## 2015-05-06 VITALS — BP 124/78 | HR 62 | Temp 97.6°F | Resp 20 | Ht 63.0 in | Wt 199.5 lb

## 2015-05-06 DIAGNOSIS — M545 Low back pain, unspecified: Secondary | ICD-10-CM

## 2015-05-06 MED ORDER — MELOXICAM 15 MG PO TABS: 15.0000 mg | ORAL_TABLET | Freq: Every day | ORAL | Status: DC

## 2015-05-06 MED ORDER — CYCLOBENZAPRINE HCL 10 MG PO TABS: 10.0000 mg | ORAL_TABLET | Freq: Three times a day (TID) | ORAL | Status: DC | PRN

## 2015-05-06 MED ORDER — MEPERIDINE HCL 50 MG PO TABS: 50.0000 mg | ORAL_TABLET | ORAL | Status: DC | PRN

## 2015-05-06 NOTE — Progress Notes (Signed)
   Subjective:    Patient ID: Brandi Berry, female    DOB: 09-Mar-1955, 60 y.o.   MRN: [id number]  HPI 60 year old Black Female with no prior history of back pain to speak of bit over to wash her face this weekend and had acute pain in her lower back bilaterally. It has not gotten better. She tried Flexeril without relief. She tried some ibuprofen. Hurts to move about. Pain not radiating into legs.    Review of Systems     Objective:   Physical Exam Straight leg raising is slightly positive at 90 bilaterally but I think is more due to spasm and radiculopathy. Deep tendon reflexes are 2+ and symmetrical in the knees. Muscle strength in the lower extremities is 5 over 5 in all groups tested. Tender over posterior superior iliac spine area bilaterally       Assessment & Plan:  Acute back strain  Plan: Meloxicam 15 mg daily #30. Continue Flexeril at bedtime. Says she cannot take it during the day because it causes drowsiness. She is unable to tolerate hydrocodone. Try Demerol 50 mg one by mouth daily at bedtime for pain. Heating pad.

## 2015-05-06 NOTE — Patient Instructions (Addendum)
Take Demerol as needed at bedtime for pain. Continue Flexeril. Meloxicam 15 mg daily. If no improvement in 7-10 days consider physical therapy.

## 2015-05-20 ENCOUNTER — Ambulatory Visit (INDEPENDENT_AMBULATORY_CARE_PROVIDER_SITE_OTHER): Payer: 59 | Admitting: Internal Medicine

## 2015-05-20 ENCOUNTER — Encounter: Payer: Self-pay | Admitting: Internal Medicine

## 2015-05-20 VITALS — BP 106/78 | HR 68 | Temp 97.1°F | Resp 20 | Wt 196.0 lb

## 2015-05-20 DIAGNOSIS — J069 Acute upper respiratory infection, unspecified: Secondary | ICD-10-CM

## 2015-05-20 MED ORDER — PREDNISONE 10 MG PO TABS: ORAL_TABLET | ORAL | Status: DC

## 2015-05-20 MED ORDER — BENZONATATE 100 MG PO CAPS: 100.0000 mg | ORAL_CAPSULE | Freq: Three times a day (TID) | ORAL | Status: DC | PRN

## 2015-05-20 MED ORDER — LEVOFLOXACIN 500 MG PO TABS: 500.0000 mg | ORAL_TABLET | Freq: Every day | ORAL | Status: DC

## 2015-05-20 NOTE — Patient Instructions (Signed)
Sterapred DS 6 day dosepack. Tessalon perles. Levaquin 500 mg daily x 10 days. Stop meloxicam while taking steroids.

## 2015-05-20 NOTE — Progress Notes (Signed)
   Subjective:    Patient ID: Brandi Berry, female    DOB: 27-Aug-1955, 60 y.o.   MRN: [id number]  HPI Was here March 6 complaining of acute back pain that was rather severe. Was treated with Demerol which she was able to take without side effects, Flexeril , and meloxicam. Onset  about 10 days ago, myalgias and chills. Could not get warm. No vomiting or diarrhea. Febrile for 2-3 days. Did not take flu vaccine this year. Has cough and nasal congestion. No sputum production or nasal discharge. Still feels congested. Back pain improved with Meloxicam.    Review of Systems see above     Objective:   Physical Exam  Constitutional: She appears well-developed and well-nourished.  HENT:  Head: Normocephalic and atraumatic.  Right Ear: External ear normal.  Left Ear: External ear normal.  Mouth/Throat: Oropharynx is clear and moist. No oropharyngeal exudate.  Neck: Neck supple.  Pulmonary/Chest: Effort normal and breath sounds normal. No respiratory distress. She has no wheezes. She has no rales.  Lymphadenopathy:    She has no cervical adenopathy.  Vitals reviewed.         Assessment & Plan:  Acute URI  History of acute back pain-improved with meloxicam  Plan: Levaquin 500 milligrams daily for 10 days. Sterapred DS 10 mg 6 day dosepak. Tessalon Perles 200 mg 3 times daily as needed for cough. Stop meloxicam while taking steroids.

## 2016-08-04 ENCOUNTER — Encounter: Payer: Self-pay | Admitting: Internal Medicine

## 2016-08-04 ENCOUNTER — Ambulatory Visit (INDEPENDENT_AMBULATORY_CARE_PROVIDER_SITE_OTHER): Payer: 59 | Admitting: Internal Medicine

## 2016-08-04 VITALS — BP 108/68 | HR 55 | Temp 98.2°F | Ht 63.0 in | Wt 195.0 lb

## 2016-08-04 DIAGNOSIS — F439 Reaction to severe stress, unspecified: Secondary | ICD-10-CM

## 2016-08-04 DIAGNOSIS — G43919 Migraine, unspecified, intractable, without status migrainosus: Secondary | ICD-10-CM

## 2016-08-04 DIAGNOSIS — H6693 Otitis media, unspecified, bilateral: Secondary | ICD-10-CM | POA: Diagnosis not present

## 2016-08-04 MED ORDER — MEPERIDINE HCL 50 MG PO TABS: 50.0000 mg | ORAL_TABLET | ORAL | 0 refills | Status: DC | PRN

## 2016-08-04 MED ORDER — LEVOFLOXACIN 500 MG PO TABS: 500.0000 mg | ORAL_TABLET | Freq: Every day | ORAL | 0 refills | Status: DC

## 2016-08-04 MED ORDER — PREDNISONE 10 MG PO TABS: ORAL_TABLET | ORAL | 0 refills | Status: DC

## 2016-08-04 NOTE — Progress Notes (Signed)
   Subjective:    Patient ID: Brandi Berry, female    DOB: [birth year], 61 y.o.   MRN: [id number]  HPI 61 year old Female in today complaining of right ear stabbing pain onset this morning. She's had allergy symptoms. No fever or chills. No sore throat. History of migraine headaches. Is interested in trying Botox but says she will discuss at time of physical exam.  Has situational stress with her son. We discussed this at length today. He is continuing to live at home. Her husband is 40 years Brandi Berry is having some memory issues and chronic pain. He is basically housebound. She is working 2 jobs.    Review of Systems see above-no visual disturbances     Objective:   Physical Exam Skin warm and dry. Nodes none. Pharynx is slightly injected. Both TMs are injected bilaterally and are dull. Neck is supple. Chest clear. Tearful in the office today talking about her son.       Assessment & Plan:  Bilateral otitis media  Migraine headaches  Situational stress  Plan: Levaquin 500 milligrams daily for 10 days. Sterapred DS 10 mg 6 day dosepak to take in tapering course as directed going from 60 mg to 0 mg over 7 days. Demerol 50 mg #20 tablets one by mouth every 6 hours when necessary pain with no refill.  Spent 25 minutes with patient talking about these issues.

## 2016-08-04 NOTE — Patient Instructions (Addendum)
Demerol 50 mg every 6 hours when necessary pain. Sterapred DS 10 mg 6 day dosepak to take in tapering course as directed. Levaquin 500 milligrams daily for 10 days.

## 2016-09-29 ENCOUNTER — Other Ambulatory Visit: Payer: 59 | Admitting: Internal Medicine

## 2016-09-29 ENCOUNTER — Encounter: Payer: Self-pay | Admitting: Internal Medicine

## 2016-09-29 ENCOUNTER — Telehealth: Payer: Self-pay | Admitting: Internal Medicine

## 2016-09-29 NOTE — Telephone Encounter (Signed)
She has physical exam appointment August 2. She was scheduled for fasting lab work today. She did not come. I called her and she apparently forgot. Says she'll come in tomorrow. Reminded her to be fasting.

## 2016-09-30 ENCOUNTER — Other Ambulatory Visit (INDEPENDENT_AMBULATORY_CARE_PROVIDER_SITE_OTHER): Payer: 59 | Admitting: Internal Medicine

## 2016-09-30 DIAGNOSIS — Z130 Encounter for screening for diseases of the blood and blood-forming organs and certain disorders involving the immune mechanism: Secondary | ICD-10-CM

## 2016-09-30 DIAGNOSIS — Z1321 Encounter for screening for nutritional disorder: Secondary | ICD-10-CM

## 2016-09-30 DIAGNOSIS — Z13220 Encounter for screening for lipoid disorders: Secondary | ICD-10-CM

## 2016-09-30 DIAGNOSIS — Z0000 Encounter for general adult medical examination without abnormal findings: Secondary | ICD-10-CM | POA: Diagnosis not present

## 2016-09-30 DIAGNOSIS — Z1329 Encounter for screening for other suspected endocrine disorder: Secondary | ICD-10-CM

## 2016-09-30 LAB — COMPLETE METABOLIC PANEL WITH GFR
ALBUMIN: 3.8 g/dL (ref 3.6–5.1)
ALT: 8 U/L (ref 6–29)
AST: 15 U/L (ref 10–35)
Alkaline Phosphatase: 85 U/L (ref 33–130)
BILIRUBIN TOTAL: 0.5 mg/dL (ref 0.2–1.2)
BUN: 11 mg/dL (ref 7–25)
CO2: 28 mmol/L (ref 20–31)
CREATININE: 0.73 mg/dL (ref 0.50–0.99)
Calcium: 9 mg/dL (ref 8.6–10.4)
Chloride: 110 mmol/L (ref 98–110)
GFR, EST NON AFRICAN AMERICAN: 89 mL/min (ref >=60)
GLUCOSE: 88 mg/dL (ref 65–99)
Potassium: 4.6 mmol/L (ref 3.5–5.3)
SODIUM: 147 mmol/L — AB (ref 135–146)
TOTAL PROTEIN: 6 g/dL — AB (ref 6.1–8.1)

## 2016-09-30 LAB — LIPID PANEL
Cholesterol: 219 mg/dL — ABNORMAL HIGH (ref <200)
HDL: 84 mg/dL (ref >50)
LDL CALC: 121 mg/dL — AB (ref <100)
Total CHOL/HDL Ratio: 2.6 Ratio (ref <5.0)
Triglycerides: 70 mg/dL (ref <150)
VLDL: 14 mg/dL (ref <30)

## 2016-09-30 LAB — CBC WITH DIFFERENTIAL/PLATELET
BASOS ABS: 53 {cells}/uL (ref 0–200)
BASOS PCT: 1 %
EOS ABS: 106 {cells}/uL (ref 15–500)
Eosinophils Relative: 2 %
HCT: 41.5 % (ref 35.0–45.0)
HEMOGLOBIN: 13.6 g/dL (ref 11.7–15.5)
LYMPHS ABS: 1272 {cells}/uL (ref 850–3900)
Lymphocytes Relative: 24 %
MCH: 30.6 pg (ref 27.0–33.0)
MCHC: 32.8 g/dL (ref 32.0–36.0)
MCV: 93.3 fL (ref 80.0–100.0)
MONO ABS: 477 {cells}/uL (ref 200–950)
MPV: 9.5 fL (ref 7.5–12.5)
Monocytes Relative: 9 %
NEUTROS ABS: 3392 {cells}/uL (ref 1500–7800)
Neutrophils Relative %: 64 %
Platelets: 290 10*3/uL (ref 140–400)
RBC: 4.45 MIL/uL (ref 3.80–5.10)
RDW: 13.4 % (ref 11.0–15.0)
WBC: 5.3 10*3/uL (ref 3.8–10.8)

## 2016-09-30 LAB — TSH: TSH: 1.93 m[IU]/L

## 2016-09-30 LAB — VITAMIN D 25 HYDROXY (VIT D DEFICIENCY, FRACTURES): Vit D, 25-Hydroxy: 24 ng/mL — ABNORMAL LOW (ref 30–100)

## 2016-10-01 ENCOUNTER — Encounter: Payer: Self-pay | Admitting: Internal Medicine

## 2016-10-01 ENCOUNTER — Ambulatory Visit (INDEPENDENT_AMBULATORY_CARE_PROVIDER_SITE_OTHER): Payer: 59 | Admitting: Internal Medicine

## 2016-10-01 VITALS — BP 120/82 | HR 60 | Temp 98.2°F | Ht 63.0 in | Wt 200.0 lb

## 2016-10-01 DIAGNOSIS — Z0000 Encounter for general adult medical examination without abnormal findings: Secondary | ICD-10-CM

## 2016-10-01 DIAGNOSIS — N3941 Urge incontinence: Secondary | ICD-10-CM

## 2016-10-01 DIAGNOSIS — E559 Vitamin D deficiency, unspecified: Secondary | ICD-10-CM

## 2016-10-01 DIAGNOSIS — R519 Headache, unspecified: Secondary | ICD-10-CM

## 2016-10-01 DIAGNOSIS — F439 Reaction to severe stress, unspecified: Secondary | ICD-10-CM

## 2016-10-01 DIAGNOSIS — Z1211 Encounter for screening for malignant neoplasm of colon: Secondary | ICD-10-CM

## 2016-10-01 DIAGNOSIS — Z8669 Personal history of other diseases of the nervous system and sense organs: Secondary | ICD-10-CM

## 2016-10-01 DIAGNOSIS — Z8709 Personal history of other diseases of the respiratory system: Secondary | ICD-10-CM

## 2016-10-01 DIAGNOSIS — R51 Headache: Secondary | ICD-10-CM

## 2016-10-01 DIAGNOSIS — E7800 Pure hypercholesterolemia, unspecified: Secondary | ICD-10-CM

## 2016-10-01 LAB — POCT URINALYSIS DIPSTICK
Bilirubin, UA: NEGATIVE
Blood, UA: NEGATIVE
Glucose, UA: NEGATIVE
Ketones, UA: NEGATIVE
LEUKOCYTES UA: NEGATIVE
Nitrite, UA: NEGATIVE
PROTEIN UA: NEGATIVE
Spec Grav, UA: 1.02 (ref 1.010–1.025)
UROBILINOGEN UA: 0.2 U/dL
pH, UA: 7.5 (ref 5.0–8.0)

## 2016-10-01 NOTE — Progress Notes (Addendum)
   Subjective:    Patient ID: Brandi Berry, female    DOB: 10/01/1955, 61 y.o.   MRN: [id number]  HPI  61 year old  Black Female for health maintenance exam and evaluation of medical issues. Has symptoms consistent with UTI. Has frequent headaches which he thought were migraine. She can't seem to get relief without taking prednisone. She has a lot of situational stress with her husband who is older and in poor health and basically is housebound. She works 2 jobs. Son, Brandi Berry, has been in trouble with the law. Not sure if he is living at home at the present time.  Patient agrees to see neurologist for evaluation of headaches.  Social history: Married. Husband is 34 years old. One son age 68 years old. Patient works at [employer] and also [employer]  She is allergic to Penicillin.  Formerly smoked less than 1 pack of cigarettes daily. Quit in the late 1990s. Social alcohol consumption. She has a Scientist, water quality.   Left ectopic pregnancy 1996  Allergy skin testing 2004 revealed positive test to moles.  History of mixed stress and urge urinary incontinence seen by urology 2010. History of microscopic hematuria that is benign. Tried Detrol and Vesicare without much  Success.  Saw podiatrist in 2005 for corns and hammertoe deformity.     Review of Systems urge incontinence     Objective:   Physical Exam  Constitutional: She is oriented to person, place, and time. She appears well-developed and well-nourished. No distress.  HENT:  Head: Normocephalic and atraumatic.  Right Ear: External ear normal.  Left Ear: External ear normal.  Mouth/Throat: Oropharynx is clear and moist.  Eyes: Pupils are equal, round, and reactive to light. Conjunctivae are normal.  Neck: Neck supple. No JVD present. No thyromegaly present.  Cardiovascular: Normal rate, regular rhythm and normal heart sounds.   No murmur heard. Pulmonary/Chest: No respiratory distress. She has no  wheezes. She has no rales. She exhibits no tenderness.  Breasts normal female without masses  Abdominal: She exhibits no distension and no mass. There is no tenderness. There is no rebound and no guarding.  Genitourinary:  Genitourinary Comments: Deferred to GYN  Musculoskeletal: She exhibits no edema.  Lymphadenopathy:    She has no cervical adenopathy.  Neurological: She is alert and oriented to person, place, and time.  Skin: Skin is warm and dry. No rash noted. She is not diaphoretic.  Psychiatric: She has a normal mood and affect. Her behavior is normal. Judgment and thought content normal.  Vitals reviewed.         Assessment & Plan:  Frequent migraine headaches  Allergic rhinitis  Urge urinary incontinence  Vitamin D deficiency-low at 24. Recommend 2000 units vitamin D 3 daily  Hyperlipidemia-LDL is 121 and total cholesterol 219 recommend diet and exercise  Plan: Patient agrees to see neurologist. She is asking about possibility of Botox injections. Have prescribed Protonix for possible GE reflux. Refer to GI for colonoscopy  With regard to health care maintenance she needs annual mammogram. I do not see where she has had a screening colonoscopy although an order was placed for her in 2016

## 2016-10-08 ENCOUNTER — Ambulatory Visit (INDEPENDENT_AMBULATORY_CARE_PROVIDER_SITE_OTHER): Payer: 59 | Admitting: Internal Medicine

## 2016-10-08 ENCOUNTER — Encounter: Payer: Self-pay | Admitting: Internal Medicine

## 2016-10-08 ENCOUNTER — Ambulatory Visit
Admission: RE | Admit: 2016-10-08 | Discharge: 2016-10-08 | Disposition: A | Discharge status: Home / Self Care | Payer: 59 | Source: Ambulatory Visit | Attending: Internal Medicine | Admitting: Internal Medicine

## 2016-10-08 VITALS — BP 128/98 | HR 78 | Temp 98.6°F | Wt 198.0 lb

## 2016-10-08 DIAGNOSIS — F439 Reaction to severe stress, unspecified: Secondary | ICD-10-CM | POA: Diagnosis not present

## 2016-10-08 DIAGNOSIS — R10815 Periumbilic abdominal tenderness: Secondary | ICD-10-CM | POA: Diagnosis not present

## 2016-10-08 DIAGNOSIS — R109 Unspecified abdominal pain: Secondary | ICD-10-CM | POA: Diagnosis not present

## 2016-10-08 DIAGNOSIS — R197 Diarrhea, unspecified: Secondary | ICD-10-CM

## 2016-10-08 LAB — CBC WITH DIFFERENTIAL/PLATELET
Basophils Absolute: 0 cells/uL (ref 0–200)
Basophils Relative: 0 %
EOS ABS: 0 {cells}/uL — AB (ref 15–500)
Eosinophils Relative: 0 %
HEMATOCRIT: 43.7 % (ref 35.0–45.0)
Hemoglobin: 14.8 g/dL (ref 11.7–15.5)
Lymphocytes Relative: 7 %
Lymphs Abs: 546 cells/uL — ABNORMAL LOW (ref 850–3900)
MCH: 31.4 pg (ref 27.0–33.0)
MCHC: 33.9 g/dL (ref 32.0–36.0)
MCV: 92.8 fL (ref 80.0–100.0)
MONO ABS: 156 {cells}/uL — AB (ref 200–950)
MPV: 10 fL (ref 7.5–12.5)
Monocytes Relative: 2 %
NEUTROS PCT: 91 %
Neutro Abs: 7098 cells/uL (ref 1500–7800)
Platelets: 331 10*3/uL (ref 140–400)
RBC: 4.71 MIL/uL (ref 3.80–5.10)
RDW: 13.4 % (ref 11.0–15.0)
WBC: 7.8 10*3/uL (ref 3.8–10.8)

## 2016-10-08 LAB — POCT URINALYSIS DIPSTICK
BILIRUBIN UA: NEGATIVE
GLUCOSE UA: NEGATIVE
KETONES UA: NEGATIVE
Leukocytes, UA: NEGATIVE
Nitrite, UA: NEGATIVE
Protein, UA: NEGATIVE
RBC UA: NEGATIVE
SPEC GRAV UA: 1.02 (ref 1.010–1.025)
Urobilinogen, UA: 0.2 E.U./dL
pH, UA: 5 (ref 5.0–8.0)

## 2016-10-08 LAB — COMPLETE METABOLIC PANEL WITH GFR
ALK PHOS: 98 U/L (ref 33–130)
ALT: 9 U/L (ref 6–29)
AST: 16 U/L (ref 10–35)
Albumin: 4.1 g/dL (ref 3.6–5.1)
BUN: 13 mg/dL (ref 7–25)
CALCIUM: 9.5 mg/dL (ref 8.6–10.4)
CHLORIDE: 104 mmol/L (ref 98–110)
CO2: 24 mmol/L (ref 20–32)
CREATININE: 0.74 mg/dL (ref 0.50–0.99)
GFR, Est Non African American: 87 mL/min (ref >=60)
Glucose, Bld: 113 mg/dL — ABNORMAL HIGH (ref 65–99)
POTASSIUM: 3.8 mmol/L (ref 3.5–5.3)
Sodium: 140 mmol/L (ref 135–146)
Total Bilirubin: 0.4 mg/dL (ref 0.2–1.2)
Total Protein: 6.6 g/dL (ref 6.1–8.1)

## 2016-10-08 LAB — LIPASE: LIPASE: 21 U/L (ref 7–60)

## 2016-10-08 LAB — AMYLASE: AMYLASE: 64 U/L (ref 21–101)

## 2016-10-08 MED ORDER — PANTOPRAZOLE SODIUM 40 MG PO TBEC: 40.0000 mg | DELAYED_RELEASE_TABLET | Freq: Every day | ORAL | 2 refills | Status: DC

## 2016-10-08 MED ORDER — ONDANSETRON HCL 4 MG PO TABS: 4.0000 mg | ORAL_TABLET | Freq: Three times a day (TID) | ORAL | 0 refills | Status: DC | PRN

## 2016-10-08 NOTE — Progress Notes (Signed)
   Subjective:    Patient ID: Brandi Berry, female    DOB: [birth year], 61 y.o.   MRN: [id number]  HPI Pt was just here on August 2nd for CPE. No c/o of abdominal pain at that time.  For the past few days, complaining of some vague epigastric discomfort. Nausea but no vomiting. Last bowel movement was 2 days ago. No dysuria but urinary frequency. Says she feels "hot "inside. Doesn't have any specific complaint of localized abdominal pain.  Dipstick UA is within normal limits.  Has had some loose stools but not a lot of diarrhea. No blood in bowel movement.    Review of Systems see above     Objective:   Physical Exam Abdomen is obese and slightly distended. She is tender in the epigastric area without rebound tenderness. No right upper quadrant tenderness to exam and no hepatosplenomegaly.  CBC with differential C -met amylase and lipase pending. Sent for KUB flat and upright.       Assessment & Plan:  Abdominal pain etiology unclear  Plan: Lab work drawn and pending. Does not apparently have a UTI based on dipstick UA being normal. Sent for KUB flat and upright abdominal films.  Addendum: KUB is within normal limits. Zofran 4 mg tablets every 8 hours when necessary nausea. Protonix 40 mg daily.

## 2016-10-09 ENCOUNTER — Encounter: Payer: Self-pay | Admitting: Gastroenterology

## 2016-10-17 ENCOUNTER — Encounter: Payer: Self-pay | Admitting: Internal Medicine

## 2016-10-17 NOTE — Patient Instructions (Addendum)
Clear liquids until abdominal pain and loose stools have resolved and advance diet slowly. Amylase and lipase drawn. KUB flat and upright abdominal films are within normal limits. Can do further testing if symptoms do not improve. Protonix 40 mg daily. Take Zofran 4 mg tablet sparingly for nausea.

## 2016-10-17 NOTE — Patient Instructions (Addendum)
Appointment will be made for Neurologist and for screening colonoscopy. Watch diet.

## 2016-11-18 ENCOUNTER — Other Ambulatory Visit: Payer: Self-pay | Admitting: Internal Medicine

## 2016-11-18 DIAGNOSIS — Z1231 Encounter for screening mammogram for malignant neoplasm of breast: Secondary | ICD-10-CM

## 2016-11-23 ENCOUNTER — Encounter: Payer: Self-pay | Admitting: Neurology

## 2016-11-23 ENCOUNTER — Ambulatory Visit (INDEPENDENT_AMBULATORY_CARE_PROVIDER_SITE_OTHER): Payer: 59 | Admitting: Neurology

## 2016-11-23 VITALS — BP 135/80 | HR 60 | Ht 63.0 in | Wt 198.6 lb

## 2016-11-23 DIAGNOSIS — R519 Headache, unspecified: Secondary | ICD-10-CM

## 2016-11-23 DIAGNOSIS — G4440 Drug-induced headache, not elsewhere classified, not intractable: Secondary | ICD-10-CM

## 2016-11-23 DIAGNOSIS — R51 Headache: Secondary | ICD-10-CM | POA: Diagnosis not present

## 2016-11-23 DIAGNOSIS — G43709 Chronic migraine without aura, not intractable, without status migrainosus: Secondary | ICD-10-CM | POA: Diagnosis not present

## 2016-11-23 DIAGNOSIS — G8929 Other chronic pain: Secondary | ICD-10-CM

## 2016-11-23 MED ORDER — TIZANIDINE HCL 4 MG PO TABS: 4.0000 mg | ORAL_TABLET | Freq: Four times a day (QID) | ORAL | 6 refills | Status: DC | PRN

## 2016-11-23 MED ORDER — PREDNISONE 10 MG PO TABS: ORAL_TABLET | ORAL | 0 refills | Status: DC

## 2016-11-23 NOTE — Progress Notes (Signed)
[facility]    Provider:  Dr Jaynee Eagles Referring Provider: Elby Showers, MD Primary Care Physician:  Elby Showers, MD  CC:  Migraines  HPI:  Brandi Berry is a 61 y.o. female here as a referral from Dr. Renold Genta for migraines. Past medical history of vitamin D deficiency, hyperlipidemia, migraines, recent stressors in her life. She has had migraines for 4-5 years. She examined her diet, no excessive caffeine, no medication overuse. Caffeine helps. She has been taking excedrin migraine (Tylenol, asa, caffeine) every other day as well as alka-seltzer for her headaches. Steroids help. She has been taking excedrin. Wine can trigger headaches. The migraines are above the right eye, throbbing,  She has 20 headache days a month and most are migrainous, smells will triger, strong perfumes, activity worsens the headache, the light doesn't bother her too much nor sound, she has nausea. Headaches lasts all day or days until she takes something. Some blurry vision, she gets flashes of lights sometimes before the migraines. No snoring or excessive daytime fatigue or morning headaches. She also takes alkaseltzer for her morning headaches. No other focal neurologic deficits, associated symptoms, inciting events or modifiable factors.  Reviewed notes, labs and imaging from outside physicians, which showed:  Relpax, amitriptyline, Topamax, Relpax, zofran, prednisone,   Review primary care notes, patient has frequent headaches which he thought or migraines, she can't seem to get relief without taking prednisone. Lots of situational stress with her husband, he is in poor health and housebound, she works 2 jobs, her son has been in trouble with the law. She is a previous smoker. Exam and occluding neurologic exam were normal. Patient does have vitamin D deficiency, hyperlipidemia.  IMPRESSION: 1. No evidence of acute ischemia. 2.  Non specific subcortical white matter changes  supratentorially probably representing areas of ischemic gliosis related  to small vessel disease due to hypertension and/or diabetes.   3.  Focal 9.9 mm soft tissue prominence of the prevertebral soft tissues at the level of C2.  This may represent significant focal pathology.  Further evaluation with a dedicated CT of the nasal/oropharynx is suggested for further clarification  . 3.  Inflammatory mucosal thickening of the paranasal sinuses.  Review of Systems: Patient complains of symptoms per HPI as well as the following symptoms: headaches. Pertinent negatives and positives per HPI. All others negative.   Social History   Social History  . Marital status: Married    Spouse name: N/A  . Number of children: N/A  . Years of education: N/A   Occupational History  . Not on file.   Social History Main Topics  . Smoking status: Former Smoker    Quit date: 03/02/1993  . Smokeless tobacco: Never Used  . Alcohol use 2.0 oz/week    4 Standard drinks or equivalent per week  . Drug use: No  . Sexual activity: Not on file   Other Topics Concern  . Not on file   Social History Narrative   Caffeine very little .  Lives at home with husband, Percell Miller.  One child.  [employer].  Works [employer].     Family History  Problem Relation Age of Onset  . Healthy Sister   . Stroke Father        also alocholic    Past Medical History:  Diagnosis Date  . Allergy   . Microscopic hematuria   . Migraine   . Urinary incontinence     Past Surgical History:  Procedure  Laterality Date  . CESAREAN SECTION    . EYE SURGERY      Current Outpatient Prescriptions  Medication Sig Dispense Refill  . aspirin-acetaminophen-caffeine (EXCEDRIN MIGRAINE) 250-250-65 MG per tablet Take by mouth every 6 (six) hours as needed for headache.    . Multiple Vitamin (MULTIVITAMIN) capsule Take 1 capsule by mouth daily.      . ondansetron (ZOFRAN) 4 MG tablet Take 1 tablet (4 mg total) by mouth  every 8 (eight) hours as needed for nausea or vomiting. 20 tablet 0  . pantoprazole (PROTONIX) 40 MG tablet Take 1 tablet (40 mg total) by mouth daily. 30 tablet 2  . predniSONE (DELTASONE) 10 MG tablet Take 1 tablet (10 mg total) by mouth daily with breakfast. Taken tapering dose as directed-6-6-5-5-4-4-3-3-2-2-1-1 50 tablet 0  . tiZANidine (ZANAFLEX) 4 MG tablet Take 1 tablet (4 mg total) by mouth every 6 (six) hours as needed for muscle spasms. 60 tablet 6   No current facility-administered medications for this visit.     Allergies as of 11/23/2016 - Review Complete 11/23/2016  Allergen Reaction Noted  . Penicillins  09/29/2010  . Vicodin [hydrocodone-acetaminophen]  09/29/2010    Vitals: BP 135/80   Pulse 60   Ht 5\' 3"  (1.6 m)   Wt 198 lb 9.6 oz (90.1 kg)   BMI 35.18 kg/m  Last Weight:  Wt Readings from Last 1 Encounters:  11/23/16 198 lb 9.6 oz (90.1 kg)   Last Height:   Ht Readings from Last 1 Encounters:  11/23/16 5\' 3"  (1.6 m)    Physical exam: Exam: Gen: NAD, conversant, well nourised, obese, well groomed                     CV: RRR, no MRG. No Carotid Bruits. No peripheral edema, warm, nontender Eyes: Conjunctivae clear without exudates or hemorrhage  Neuro: Detailed Neurologic Exam  Speech:    Speech is normal; fluent and spontaneous with normal comprehension.  Cognition:    The patient is oriented to person, place, and time;     recent and remote memory intact;     language fluent;     normal attention, concentration,     fund of knowledge Cranial Nerves:    The pupils are equal, round, and reactive to light. The fundi are normal and spontaneous venous pulsations are present. Visual fields are full to finger confrontation. Extraocular movements are intact. Trigeminal sensation is intact and the muscles of mastication are normal. The face is symmetric. The palate elevates in the midline. Hearing intact. Voice is normal. Shoulder shrug is normal. The tongue  has normal motion without fasciculations.   Coordination:    Normal finger to nose and heel to shin. Normal rapid alternating movements.   Gait:    Heel-toe and tandem gait are normal.   Motor Observation:    No asymmetry, no atrophy, and no involuntary movements noted. Tone:    Normal muscle tone.    Posture:    Posture is normal. normal erect    Strength:    Strength is V/V in the upper and lower limbs.      Sensation: intact to LT     Reflex Exam:  DTR's:    Deep tendon reflexes in the upper and lower extremities are normal bilaterally.   Toes:    The toes are downgoing bilaterally.   Clonus:    Clonus is absent.      Assessment/Plan:  Migraines and Chronic daily headaches  due to medication overuse  I had a long discussion with patient that her excessive use of excedrin and alka-seltzer can cause medication overuse/rebound headache which is likely contributing to her chronic daily headaches. They only thing to do is to stop the medication.  Unfortunately in the timeframe after stopping, at her headaches may get much worse. I will give her some medication to try to bridge that. She should significantly  improve with her chronic daily headaches after 2-4 weeks of being off her daily over-the-counter medications. Do not use these medications more than 2 times in a week.  Given headaches with positional quality and findings from last MRI of possible soft tissue mass in 2011 recommend repeat MRI of the brain for masses, lesions or other causes of her chronic daily headaches.  Migraines: Discussed migraine management including acute management and preventative management. If headaches continue after withdrawal of Excedrin and Alka seltzer I think patient would be a good candidate for Botox for migraines.  Remember to drink plenty of fluid, eat healthy meals and do not skip any meals. Try to eat protein with a every meal and eat a healthy snack such as fruit or nuts in between  meals. Try to keep a regular sleep-wake schedule and try to exercise daily, particularly in the form of walking, 20-30 minutes a day, if you can.   As far as your medications are concerned, I would like to suggest: - Stop daily over the counter med use (Tylenol, excedrin, ibuprofen, alleve, alka-seltzer) Do not tak emore than 2 times in one week - For the next 2 weeks take steroids and tizanidine. Watch for sedation,   Discussed; To prevent or relieve headaches, try the following:  Cool Compress. Lie down and place a cool compress on your head.   Avoid headache triggers. If certain foods or odors seem to have triggered your migraines in the past, avoid them. A headache diary might help you identify triggers.   Include physical activity in your daily routine. Try a daily walk or other moderate aerobic exercise.   Manage stress. Find healthy ways to cope with the stressors, such as delegating tasks on your to-do list.   Practice relaxation techniques. Try deep breathing, yoga, massage and visualization.   Eat regularly. Eating regularly scheduled meals and maintaining a healthy diet might help prevent headaches. Also, drink plenty of fluids.   Follow a regular sleep schedule. Sleep deprivation might contribute to headaches  Consider biofeedback. With this mind-body technique, you learn to control certain bodily functions - such as muscle tension, heart rate and blood pressure - to prevent headaches or reduce headache pain.    Proceed to emergency room if you experience new or worsening symptoms or symptoms do not resolve, if you have new neurologic symptoms or if headache is severe, or for any concerning symptom.       MRI brain w/wo contrast (had focal findings on last MRI need to follow up, also new onset headache after the age of 30) Steroids and tizanidine  Orders Placed This Encounter  Procedures  . MR BRAIN W WO CONTRAST    Sarina Ill, MD  [facility] [address] [place], [place] [address]  Phone [phone] Fax [phone]

## 2016-11-23 NOTE — Patient Instructions (Addendum)
MRI brain  Tizanidine tablets or capsules What is this medicine? TIZANIDINE (tye ZAN i deen) helps to relieve muscle spasms. It may be used to help in the treatment of multiple sclerosis and spinal cord injury. This medicine may be used for other purposes; ask your health care provider or pharmacist if you have questions. COMMON BRAND NAME(S): Zanaflex What should I tell my health care provider before I take this medicine? They need to know if you have any of these conditions: -kidney disease -liver disease -low blood pressure -mental disorder -an unusual or allergic reaction to tizanidine, other medicines, lactose (tablets only), foods, dyes, or preservatives -pregnant or trying to get pregnant -breast-feeding How should I use this medicine? Take this medicine by mouth with a full glass of water. Take this medicine on an empty stomach, at least 30 minutes before or 2 hours after food. Do not take with food unless you talk with your doctor. Follow the directions on the prescription label. Take your medicine at regular intervals. Do not take your medicine more often than directed. Do not stop taking except on your doctor's advice. Suddenly stopping the medicine can be very dangerous. Talk to your pediatrician regarding the use of this medicine in children. Patients over 67 years old may have a stronger reaction and need a smaller dose. Overdosage: If you think you have taken too much of this medicine contact a poison control center or emergency room at once. NOTE: This medicine is only for you. Do not share this medicine with others. What if I miss a dose? If you miss a dose, take it as soon as you can. If it is almost time for your next dose, take only that dose. Do not take double or extra doses. What may interact with this medicine? Do not take this medicine with any of the following medications: -ciprofloxacin -cisapride -dofetilide -dronedarone -fluvoxamine -narcotic medicines for  cough -pimozide -thiabendazole -thioridazine -ziprasidone This medicine may also interact with the following medications: -acyclovir -alcohol -antihistamines for allergy, cough and cold -baclofen -certain antibiotics like levofloxacin, ofloxacin -certain medicines for anxiety or sleep -certain medicines for blood pressure, heart disease, irregular heart beat -certain medicines for depression like amitriptyline, fluoxetine, sertraline -certain medicines for seizures like phenobarbital, primidone -certain medicines for stomach problems like cimetidine, famotidine -female hormones, like estrogens or progestins and birth control pills, patches, rings, or injections -general anesthetics like halothane, isoflurane, methoxyflurane, propofol -local anesthetics like lidocaine, pramoxine, tetracaine -medicines that relax muscles for surgery -narcotic medicines for pain -other medicines that prolong the QT interval (cause an abnormal heart rhythm) -phenothiazines like chlorpromazine, mesoridazine, prochlorperazine -ticlopidine -zileuton This list may not describe all possible interactions. Give your health care provider a list of all the medicines, herbs, non-prescription drugs, or dietary supplements you use. Also tell them if you smoke, drink alcohol, or use illegal drugs. Some items may interact with your medicine. What should I watch for while using this medicine? Tell your doctor or health care professional if your symptoms do not start to get better or if they get worse. You may get drowsy or dizzy. Do not drive, use machinery, or do anything that needs mental alertness until you know how this medicine affects you. Do not stand or sit up quickly, especially if you are an older patient. This reduces the risk of dizzy or fainting spells. Alcohol may interfere with the effect of this medicine. Avoid alcoholic drinks. If you are taking another medicine that also causes drowsiness, you may have  more side effects. Give your health care provider a list of all medicines you use. Your doctor will tell you how much medicine to take. Do not take more medicine than directed. Call emergency for help if you have problems breathing or unusual sleepiness. Your mouth may get dry. Chewing sugarless gum or sucking hard candy, and drinking plenty of water may help. Contact your doctor if the problem does not go away or is severe. What side effects may I notice from receiving this medicine? Side effects that you should report to your doctor or health care professional as soon as possible: -allergic reactions like skin rash, itching or hives, swelling of the face, lips, or tongue -breathing problems -hallucinations -signs and symptoms of liver injury like dark yellow or brown urine; general ill feeling or flu-like symptoms; light-colored stools; loss of appetite; nausea; right upper quadrant belly pain; unusually weak or tired; yellowing of the eyes or skin -signs and symptoms of low blood pressure like dizziness; feeling faint or lightheaded, falls; unusually weak or tired -unusually slow heartbeat -unusually weak or tired Side effects that usually do not require medical attention (report to your doctor or health care professional if they continue or are bothersome): -blurred vision -constipation -dizziness -dry mouth -tiredness This list may not describe all possible side effects. Call your doctor for medical advice about side effects. You may report side effects to FDA at 1-800-FDA-1088. Where should I keep my medicine? Keep out of the reach of children. Store at room temperature between 15 and 30 degrees C (59 and 86 degrees F). Throw away any unused medicine after the expiration date. NOTE: This sheet is a summary. It may not cover all possible information. If you have questions about this medicine, talk to your doctor, pharmacist, or health care provider.  2018 Elsevier/Gold Standard  (2014-11-27 13:52:12)  Prednisone tablets What is this medicine? PREDNISONE (PRED ni sone) is a corticosteroid. It is commonly used to treat inflammation of the skin, joints, lungs, and other organs. Common conditions treated include asthma, allergies, and arthritis. It is also used for other conditions, such as blood disorders and diseases of the adrenal glands. This medicine may be used for other purposes; ask your health care provider or pharmacist if you have questions. COMMON BRAND NAME(S): Deltasone, Predone, Sterapred, Sterapred DS What should I tell my health care provider before I take this medicine? They need to know if you have any of these conditions: -Cushing's syndrome -diabetes -glaucoma -heart disease -high blood pressure -infection (especially a virus infection such as chickenpox, cold sores, or herpes) -kidney disease -liver disease -mental illness -myasthenia gravis -osteoporosis -seizures -stomach or intestine problems -thyroid disease -an unusual or allergic reaction to lactose, prednisone, other medicines, foods, dyes, or preservatives -pregnant or trying to get pregnant -breast-feeding How should I use this medicine? Take this medicine by mouth with a glass of water. Follow the directions on the prescription label. Take this medicine with food. If you are taking this medicine once a day, take it in the morning. Do not take more medicine than you are told to take. Do not suddenly stop taking your medicine because you may develop a severe reaction. Your doctor will tell you how much medicine to take. If your doctor wants you to stop the medicine, the dose may be slowly lowered over time to avoid any side effects. Talk to your pediatrician regarding the use of this medicine in children. Special care may be needed. Overdosage: If you think you have  taken too much of this medicine contact a poison control center or emergency room at once. NOTE: This medicine is only  for you. Do not share this medicine with others. What if I miss a dose? If you miss a dose, take it as soon as you can. If it is almost time for your next dose, talk to your doctor or health care professional. You may need to miss a dose or take an extra dose. Do not take double or extra doses without advice. What may interact with this medicine? Do not take this medicine with any of the following medications: -metyrapone -mifepristone This medicine may also interact with the following medications: -aminoglutethimide -amphotericin B -aspirin and aspirin-like medicines -barbiturates -certain medicines for diabetes, like glipizide or glyburide -cholestyramine -cholinesterase inhibitors -cyclosporine -digoxin -diuretics -ephedrine -female hormones, like estrogens and birth control pills -isoniazid -ketoconazole -NSAIDS, medicines for pain and inflammation, like ibuprofen or naproxen -phenytoin -rifampin -toxoids -vaccines -warfarin This list may not describe all possible interactions. Give your health care provider a list of all the medicines, herbs, non-prescription drugs, or dietary supplements you use. Also tell them if you smoke, drink alcohol, or use illegal drugs. Some items may interact with your medicine. What should I watch for while using this medicine? Visit your doctor or health care professional for regular checks on your progress. If you are taking this medicine over a prolonged period, carry an identification card with your name and address, the type and dose of your medicine, and your doctor's name and address. This medicine may increase your risk of getting an infection. Tell your doctor or health care professional if you are around anyone with measles or chickenpox, or if you develop sores or blisters that do not heal properly. If you are going to have surgery, tell your doctor or health care professional that you have taken this medicine within the last twelve  months. Ask your doctor or health care professional about your diet. You may need to lower the amount of salt you eat. This medicine may affect blood sugar levels. If you have diabetes, check with your doctor or health care professional before you change your diet or the dose of your diabetic medicine. What side effects may I notice from receiving this medicine? Side effects that you should report to your doctor or health care professional as soon as possible: -allergic reactions like skin rash, itching or hives, swelling of the face, lips, or tongue -changes in emotions or moods -changes in vision -depressed mood -eye pain -fever or chills, cough, sore throat, pain or difficulty passing urine -increased thirst -swelling of ankles, feet Side effects that usually do not require medical attention (report to your doctor or health care professional if they continue or are bothersome): -confusion, excitement, restlessness -headache -nausea, vomiting -skin problems, acne, thin and shiny skin -trouble sleeping -weight gain This list may not describe all possible side effects. Call your doctor for medical advice about side effects. You may report side effects to FDA at 1-800-FDA-1088. Where should I keep my medicine? Keep out of the reach of children. Store at room temperature between 15 and 30 degrees C (59 and 86 degrees F). Protect from light. Keep container tightly closed. Throw away any unused medicine after the expiration date. NOTE: This sheet is a summary. It may not cover all possible information. If you have questions about this medicine, talk to your doctor, pharmacist, or health care provider.  2018 Elsevier/Gold Standard (2010-10-02 10:57:14)

## 2016-12-08 ENCOUNTER — Ambulatory Visit (AMBULATORY_SURGERY_CENTER): Payer: Self-pay

## 2016-12-08 VITALS — Ht 64.0 in | Wt 199.2 lb

## 2016-12-08 DIAGNOSIS — Z1211 Encounter for screening for malignant neoplasm of colon: Secondary | ICD-10-CM

## 2016-12-08 MED ORDER — NA SULFATE-K SULFATE-MG SULF 17.5-3.13-1.6 GM/177ML PO SOLN: 1.0000 | Freq: Once | ORAL | 0 refills | Status: AC

## 2016-12-08 NOTE — Progress Notes (Signed)
Denies allergies to eggs or soy products. Denies complication of anesthesia or sedation. Denies use of weight loss medication. Denies use of O2.   Emmi instructions declined.  

## 2016-12-14 ENCOUNTER — Encounter: Payer: Self-pay | Admitting: Gastroenterology

## 2016-12-22 ENCOUNTER — Encounter: Payer: Self-pay | Admitting: Gastroenterology

## 2016-12-22 ENCOUNTER — Ambulatory Visit (AMBULATORY_SURGERY_CENTER): Payer: 59 | Admitting: Gastroenterology

## 2016-12-22 VITALS — BP 108/65 | HR 64 | Temp 97.5°F | Resp 10 | Ht 63.0 in | Wt 198.0 lb

## 2016-12-22 DIAGNOSIS — Z1211 Encounter for screening for malignant neoplasm of colon: Secondary | ICD-10-CM | POA: Diagnosis present

## 2016-12-22 DIAGNOSIS — Z1212 Encounter for screening for malignant neoplasm of rectum: Secondary | ICD-10-CM | POA: Diagnosis not present

## 2016-12-22 MED ORDER — SODIUM CHLORIDE 0.9 % IV SOLN: 500.0000 mL | INTRAVENOUS | Status: DC

## 2016-12-22 NOTE — Progress Notes (Signed)
Spontaneous respirations throughout. VSS. Resting comfortably. To PACU on room air. Report to  RN. 

## 2016-12-22 NOTE — Op Note (Signed)
[place] Patient Name: Brandi Berry Procedure Date: 12/22/2016 8:50 AM MRN: [id number] Endoscopist: [place]. Loletha Carrow , MD Age: 61 Referring MD:  Date of Birth: 1956/02/25 Gender: Female Account #: [id number] Procedure:                Colonoscopy Indications:              Screening for colorectal malignant neoplasm, This                            is the patient's first colonoscopy Medicines:                Monitored Anesthesia Care Procedure:                Pre-Anesthesia Assessment:                           - Prior to the procedure, a History and Physical                            was performed, and patient medications and                            allergies were reviewed. The patient's tolerance of                            previous anesthesia was also reviewed. The risks                            and benefits of the procedure and the sedation                            options and risks were discussed with the patient.                            All questions were answered, and informed consent                            was obtained. Prior Anticoagulants: The patient has                            taken no previous anticoagulant or antiplatelet                            agents. ASA Grade Assessment: II - A patient with                            mild systemic disease. After reviewing the risks                            and benefits, the patient was deemed in                            satisfactory condition to undergo the procedure.  After obtaining informed consent, the colonoscope                            was passed under direct vision. Throughout the                            procedure, the patient's blood pressure, pulse, and                            oxygen saturations were monitored continuously. The                            Model PCF-H190DL [id number]) scope was introduced                            through the anus  and advanced to the the cecum,                            identified by appendiceal orifice and ileocecal                            valve. The colonoscopy was performed without                            difficulty. The patient tolerated the procedure                            well. The quality of the bowel preparation was                            excellent. The ileocecal valve, appendiceal                            orifice, and rectum were photographed. The quality                            of the bowel preparation was evaluated using the                            BBPS [facility] Bowel Preparation Scale) with scores                            of: Right Colon = 3, Transverse Colon = 3 and Left                            Colon = 3 (entire mucosa seen well with no residual                            staining, small fragments of stool or opaque                            liquid). The total BBPS score equals 9. The bowel  preparation used was SUPREP. Scope In: 8:54:16 AM Scope Out: 9:07:34 AM Scope Withdrawal Time: 0 hours 9 minutes 14 seconds  Total Procedure Duration: 0 hours 13 minutes 18 seconds  Findings:                 The perianal and digital rectal examinations were                            normal.                           The entire examined colon appeared normal on direct                            and retroflexion views. Complications:            No immediate complications. Estimated Blood Loss:     Estimated blood loss: none. Impression:               - The entire examined colon is normal on direct and                            retroflexion views.                           - No specimens collected. Recommendation:           - Patient has a contact number available for                            emergencies. The signs and symptoms of potential                            delayed complications were discussed with the                             patient. Return to normal activities tomorrow.                            Written discharge instructions were provided to the                            patient.                           - Resume previous diet.                           - Continue present medications.                           - Repeat colonoscopy in 10 years for screening                            purposes. Vinayak Bobier L. Loletha Carrow, MD 12/22/2016 9:09:34 AM This report has been signed electronically.

## 2016-12-22 NOTE — Progress Notes (Signed)
Pt's states no medical or surgical changes since previsit or office visit. 

## 2016-12-22 NOTE — Patient Instructions (Signed)
YOU HAD AN ENDOSCOPIC PROCEDURE TODAY AT THE [facility]:   Refer to the procedure report that was given to you for any specific questions about what was found during the examination.  If the procedure report does not answer your questions, please call your gastroenterologist to clarify.  If you requested that your care partner not be given the details of your procedure findings, then the procedure report has been included in a sealed envelope for you to review at your convenience later.  YOU SHOULD EXPECT: Some feelings of bloating in the abdomen. Passage of more gas than usual.  Walking can help get rid of the air that was put into your GI tract during the procedure and reduce the bloating. If you had a lower endoscopy (such as a colonoscopy or flexible sigmoidoscopy) you may notice spotting of blood in your stool or on the toilet paper. If you underwent a bowel prep for your procedure, you may not have a normal bowel movement for a few days.  Please Note:  You might notice some irritation and congestion in your nose or some drainage.  This is from the oxygen used during your procedure.  There is no need for concern and it should clear up in a day or so.  SYMPTOMS TO REPORT IMMEDIATELY:   Following lower endoscopy (colonoscopy or flexible sigmoidoscopy):  Excessive amounts of blood in the stool  Significant tenderness or worsening of abdominal pains  Swelling of the abdomen that is new, acute  Fever of 100F or higher  For urgent or emergent issues, a gastroenterologist can be reached at any hour by calling [phone].   DIET:  We do recommend a small meal at first, but then you may proceed to your regular diet.  Drink plenty of fluids but you should avoid alcoholic beverages for 24 hours.  ACTIVITY:  You should plan to take it easy for the rest of today and you should NOT DRIVE or use heavy machinery until tomorrow (because of the sedation medicines used during the test).     FOLLOW UP: Our staff will call the number listed on your records the next business day following your procedure to check on you and address any questions or concerns that you may have regarding the information given to you following your procedure. If we do not reach you, we will leave a message.  However, if you are feeling well and you are not experiencing any problems, there is no need to return our call.  We will assume that you have returned to your regular daily activities without incident.  If any biopsies were taken you will be contacted by phone or by letter within the next 1-3 weeks.  Please call us at [phone] if you have not heard about the biopsies in 3 weeks.    SIGNATURES/CONFIDENTIALITY: You and/or your care partner have signed paperwork which will be entered into your electronic medical record.  These signatures attest to the fact that that the information above on your After Visit Summary has been reviewed and is understood.  Full responsibility of the confidentiality of this discharge information lies with you and/or your care-partner. 

## 2016-12-23 ENCOUNTER — Telehealth: Payer: Self-pay

## 2016-12-23 ENCOUNTER — Ambulatory Visit (INDEPENDENT_AMBULATORY_CARE_PROVIDER_SITE_OTHER): Payer: 59

## 2016-12-23 DIAGNOSIS — R51 Headache: Secondary | ICD-10-CM

## 2016-12-23 DIAGNOSIS — G43709 Chronic migraine without aura, not intractable, without status migrainosus: Secondary | ICD-10-CM

## 2016-12-23 DIAGNOSIS — R519 Headache, unspecified: Secondary | ICD-10-CM

## 2016-12-23 DIAGNOSIS — G8929 Other chronic pain: Secondary | ICD-10-CM

## 2016-12-23 MED ORDER — GADOPENTETATE DIMEGLUMINE 469.01 MG/ML IV SOLN: 20.0000 mL | Freq: Once | INTRAVENOUS | Status: DC | PRN

## 2016-12-23 NOTE — Telephone Encounter (Signed)
  Follow up Call-  Call back number 12/22/2016  Post procedure Call Back phone  # [phone]  Permission to leave phone message Yes  Some recent data might be hidden     Patient questions:  Do you have a fever, pain , or abdominal swelling? No. Pain Score  0 *  Have you tolerated food without any problems? Yes.    Have you been able to return to your normal activities? Yes.    Do you have any questions about your discharge instructions: Diet   No. Medications  No. Follow up visit  No.  Do you have questions or concerns about your Care? No.  Actions: * If pain score is 4 or above: No action needed, pain <4.

## 2016-12-24 ENCOUNTER — Telehealth: Payer: Self-pay | Admitting: *Deleted

## 2016-12-24 NOTE — Telephone Encounter (Signed)
-----   Message from Melvenia Beam, MD sent at 12/24/2016  8:14 AM EDT ----- Normal for age thanks

## 2016-12-24 NOTE — Telephone Encounter (Signed)
Called patient, she verbalized understanding of normal MRI for her age and that her f/u appt is 12/31/2016 @ 8:45 with an arrival time of 8:15.

## 2016-12-30 NOTE — Progress Notes (Signed)
[facility]  PATIENT: Brandi Berry DOB: 12/20/1955   REASON FOR VISIT: Follow-up for migraine today  , morning headaches HISTORY FROM: Patient    HISTORY OF PRESENT ILLNESS:UPDATE 11/1/2018CM Brandi Berry, 61 year old Berry returns for follow-up.  She has Brandi history of migraine headaches for 4-5 years.  When last seen she was taking Brandi lot of over-the-counter preparations such as Excedrin Migraine.  She has stopped those.  She took Brandi prednisone Dosepak which helped.  She has taken little of her tizanidine.  She returns for reevaluation with Brandi headache today on Brandi scale of 10 at Brandi 6 or 7.  It is over the right eye  Area.  Patient claims she has been on topiramate in the past with little benefit.  On questioning when her headaches occur she wakes up with headaches many mornings.  She usually gets 6-7 hours of sleep.  Her headache in the morning may go away after Brandi couple of hours but return in late afternoon.  MRI of the brain 12/23/2016 was unremarkable showing mild age-appropriate changes of chronic microvascular ischemia and prominent soft tissue in the posterior nasopharynx no significant change when compared to MRI dated October 24, 2009.  She has never had Brandi sleep study.  She returns for reevaluation   11/23/16 [name] is Brandi 61 y.o. Berry here as Brandi referral from Dr. Renold Genta for migraines. Past medical history of vitamin D deficiency, hyperlipidemia, migraines, recent stressors in her life. She has had migraines for 4-5 years. She examined her diet, no excessive caffeine, no medication overuse. Caffeine helps. She has been taking excedrin migraine (Tylenol, asa, caffeine) every other day as well as alka-seltzer for her headaches. Steroids help. She has been taking excedrin. Wine can trigger headaches. The migraines are above the right eye, throbbing,  She has 20 headache days Brandi month and most are migrainous, smells will triger, strong perfumes,  activity worsens the headache, the light doesn't bother her too much nor sound, she has nausea. Headaches lasts all day or days until she takes something. Some blurry vision, she gets flashes of lights sometimes before the migraines. No snoring or excessive daytime fatigue or morning headaches. She also takes alkaseltzer for her morning headaches. No other focal neurologic deficits, associated symptoms, inciting events or modifiable factors.   REVIEW OF SYSTEMS: Full 14 system review of systems performed and notable only for those listed, all others are neg:  Constitutional: neg  Cardiovascular: neg Ear/Nose/Throat: neg  Skin: neg Eyes: neg Respiratory: neg Gastroitestinal: neg  Hematology/Lymphatic: neg  Endocrine: neg Musculoskeletal:neg Allergy/Immunology: neg Neurological: History of migraine headaches for 5 years, morning headaches Psychiatric: neg Sleep : neg   ALLERGIES: Allergies  Allergen Reactions  . Penicillins   . Vicodin [Hydrocodone-Acetaminophen]     HOME MEDICATIONS: Outpatient Medications Prior to Visit  Medication Sig Dispense Refill  . Multiple Vitamin (MULTIVITAMIN) capsule Take 1 capsule by mouth daily.      . ondansetron (ZOFRAN) 4 MG tablet Take 1 tablet (4 mg total) by mouth every 8 (eight) hours as needed for nausea or vomiting. 20 tablet 0  . pantoprazole (PROTONIX) 40 MG tablet Take 1 tablet (40 mg total) by mouth daily. 30 tablet 2  . tiZANidine (ZANAFLEX) 4 MG tablet Take 1 tablet (4 mg total) by mouth every 6 (six) hours as needed for muscle spasms. 60 tablet 6  . aspirin-acetaminophen-caffeine (EXCEDRIN MIGRAINE) [id number] MG per tablet Take by mouth every 6 (six) hours as needed  for headache.    . predniSONE (DELTASONE) 10 MG tablet Take 1 tablet (10 mg total) by mouth daily with breakfast. Taken tapering dose as directed-6-6-5-5-4-4-3-3-2-2-1-1 (Patient not taking: Reported on 12/31/2016) 50 tablet 0   Facility-Administered Medications Prior to  Visit  Medication Dose Route Frequency Provider Last Rate Last Dose  . 0.9 %  sodium chloride infusion  500 mL Intravenous Continuous Danis, Henry L III, MD      . gadopentetate dimeglumine (MAGNEVIST) injection 20 mL  20 mL Intravenous Once PRN Melvenia Beam, MD        PAST MEDICAL HISTORY: Past Medical History:  Diagnosis Date  . Allergy   . Microscopic hematuria   . Migraine   . Urinary incontinence     PAST SURGICAL HISTORY: Past Surgical History:  Procedure Laterality Date  . CESAREAN SECTION    . EYE SURGERY      FAMILY HISTORY: Family History  Problem Relation Age of Onset  . Healthy Sister   . Stroke Father        also alocholic  . Colon cancer Neg Hx   . Esophageal cancer Neg Hx   . Pancreatic cancer Neg Hx   . Rectal cancer Neg Hx   . Stomach cancer Neg Hx     SOCIAL HISTORY: Social History   Social History  . Marital status: Married    Spouse name: N/Brandi  . Number of children: N/Brandi  . Years of education: N/Brandi   Occupational History  . Not on file.   Social History Main Topics  . Smoking status: Former Smoker    Quit date: 03/02/1993  . Smokeless tobacco: Never Used  . Alcohol use 2.0 oz/week    4 Standard drinks or equivalent per week  . Drug use: No  . Sexual activity: Not on file   Other Topics Concern  . Not on file   Social History Narrative   Caffeine very little .  Lives at home with husband, Percell Miller.  One child.  [employer].  Works [employer]:   12/31/16 0831  BP: (!) 143/83  Pulse: 64  Weight: 201 lb 3.2 oz (91.3 kg)  Height: 5\' 3"  (1.6 m)   Body mass index is 35.64 kg/m.  Generalized: Well developed, obese Berry in no acute distress  Head: normocephalic and atraumatic,. Oropharynx benign  Neck: Supple, paraspinals tender to palpation Cardiac: Regular rate rhythm, no murmur  Musculoskeletal: No deformity   Neurological examination   Mentation: Alert oriented to time, place, history  taking. Attention span and concentration appropriate. Recent and remote memory intact.  Follows all commands speech and language fluent.   Cranial nerve II-XII: Pupils were equal round reactive to light extraocular movements were full, visual field were full on confrontational test. Facial sensation and strength were normal. hearing was intact to finger rubbing bilaterally. Uvula tongue midline. head turning and shoulder shrug were normal and symmetric.Tongue protrusion into cheek strength was normal. Motor: normal bulk and tone, full strength in the BUE, BLE, fine finger movements normal, no pronator drift. No focal weakness Sensory: normal and symmetric to light touch, pinprick, and  Vibration, in the upper and lower extremities Coordination: finger-nose-finger, heel-to-shin bilaterally, no dysmetria, no tremor Reflexes: Symmetric upper and lower, plantar responses were flexor bilaterally. Gait and Station: Rising up from seated position without assistance, normal stance,  moderate stride, good arm swing, smooth turning, able to perform tiptoe, and heel walking without difficulty.  Tandem gait is steady  DIAGNOSTIC DATA (LABS, IMAGING, TESTING) - I reviewed patient records, labs, notes, testing and imaging myself where available.  Lab Results  Component Value Date   WBC 7.8 10/08/2016   HGB 14.8 10/08/2016   HCT 43.7 10/08/2016   MCV 92.8 10/08/2016   PLT 331 10/08/2016      Component Value Date/Time   NA 140 10/08/2016 1514   K 3.8 10/08/2016 1514   CL 104 10/08/2016 1514   CO2 24 10/08/2016 1514   GLUCOSE 113 (H) 10/08/2016 1514   BUN Brandi 10/08/2016 1514   CREATININE 0.74 10/08/2016 1514   CALCIUM 9.5 10/08/2016 1514   PROT 6.6 10/08/2016 1514   ALBUMIN 4.1 10/08/2016 1514   AST 16 10/08/2016 1514   ALT 9 10/08/2016 1514   ALKPHOS 98 10/08/2016 1514   BILITOT 0.4 10/08/2016 1514   GFRNONAA 87 10/08/2016 1514   GFRAA >89 10/08/2016 1514   Lab Results  Component Value Date    CHOL 219 (H) 09/30/2016   HDL 84 09/30/2016   LDLCALC 121 (H) 09/30/2016   TRIG 70 09/30/2016   CHOLHDL 2.6 09/30/2016    Lab Results  Component Value Date   TSH 1.93 09/30/2016      ASSESSMENT AND PLAN  Brandi Berry  has Brandi past medical history of migraines for 4-5 years.  At one time she was taking Brandi lot of Excedrin Migraine but that has stopped because of medication overuse.  She also complains with morning headaches.  She woke up with Brandi headache today Brandi 6-7 on Brandi scale of 10 on the pain scale.  MRI of the brain10/24/2018 was unremarkable showing mild age-appropriate changes of chronic microvascular ischemia and prominent soft tissue in the posterior nasopharynx no significant change when compared to MRI dated October 24, 2009.  Will will set up for sleep study due to morning headaches Will hold off on preventive medication for now until results of sleep study are known IV Depacon and Solu-Medrol today for headache today, Showed  examples of neck exercises to perform several times daily since she is on Brandi computer Follow-up in 3 months Dennie Bible, [facility], [facility], [facility] [address], [place], [place] [address] [phone]

## 2016-12-31 ENCOUNTER — Encounter: Payer: Self-pay | Admitting: Nurse Practitioner

## 2016-12-31 ENCOUNTER — Ambulatory Visit (INDEPENDENT_AMBULATORY_CARE_PROVIDER_SITE_OTHER): Payer: 59 | Admitting: Nurse Practitioner

## 2016-12-31 VITALS — BP 143/83 | HR 64 | Ht 63.0 in | Wt 201.2 lb

## 2016-12-31 DIAGNOSIS — R51 Headache: Secondary | ICD-10-CM | POA: Diagnosis not present

## 2016-12-31 DIAGNOSIS — R519 Headache, unspecified: Secondary | ICD-10-CM

## 2016-12-31 DIAGNOSIS — G43809 Other migraine, not intractable, without status migrainosus: Secondary | ICD-10-CM

## 2016-12-31 NOTE — Patient Instructions (Signed)
Brandi Berry Brandi Berry set up for sleep study IV Depacon and Solu-Medrol today for headache Follow-up in 3 months

## 2016-12-31 NOTE — Progress Notes (Signed)
Personally  participated in, made any corrections needed, and agree with history, physical, neuro exam,assessment and plan as stated above.    Antonia Ahern, MD [facility] 

## 2017-02-11 ENCOUNTER — Institutional Professional Consult (permissible substitution): Payer: 59 | Admitting: Neurology

## 2017-04-05 NOTE — Progress Notes (Deleted)
[facility]  PATIENT: Brandi Berry DOB: [birth year]   REASON FOR VISIT: Follow-up for migraine today  , morning headaches HISTORY FROM: Patient    HISTORY OF PRESENT ILLNESS:UPDATE 11/1/2018CM Ms. [address] 62 year old female returns for follow-up.  She has a history of migraine headaches for 4-5 years.  When last seen she was taking a lot of over-the-counter preparations such as Excedrin Migraine.  She has stopped those.  She took a prednisone Dosepak which helped.  She has taken little of her tizanidine.  She returns for reevaluation with a headache today on a scale of 10 at a 6 or 7.  It is over the right eye  Area.  Patient claims she has been on topiramate in the past with little benefit.  On questioning when her headaches occur she wakes up with headaches many mornings.  She usually gets 6-7 hours of sleep.  Her headache in the morning may go away after a couple of hours but return in late afternoon.  MRI of the brain 12/23/2016 was unremarkable showing mild age-appropriate changes of chronic microvascular ischemia and prominent soft tissue in the posterior nasopharynx no significant change when compared to MRI dated October 24, 2009.  She has never had a sleep study.  She returns for reevaluation   11/23/16 [name] is a 62 y.o. female here as a referral from Dr. Renold Genta for migraines. Past medical history of vitamin D deficiency, hyperlipidemia, migraines, recent stressors in her life. She has had migraines for 4-5 years. She examined her diet, no excessive caffeine, no medication overuse. Caffeine helps. She has been taking excedrin migraine (Tylenol, asa, caffeine) every other day as well as alka-seltzer for her headaches. Steroids help. She has been taking excedrin. Wine can trigger headaches. The migraines are above the right eye, throbbing,  She has 20 headache days a month and most are migrainous, smells will triger, strong perfumes,  activity worsens the headache, the light doesn't bother her too much nor sound, she has nausea. Headaches lasts all day or days until she takes something. Some blurry vision, she gets flashes of lights sometimes before the migraines. No snoring or excessive daytime fatigue or morning headaches. She also takes alkaseltzer for her morning headaches. No other focal neurologic deficits, associated symptoms, inciting events or modifiable factors.   REVIEW OF SYSTEMS: Full 14 system review of systems performed and notable only for those listed, all others are neg:  Constitutional: neg  Cardiovascular: neg Ear/Nose/Throat: neg  Skin: neg Eyes: neg Respiratory: neg Gastroitestinal: neg  Hematology/Lymphatic: neg  Endocrine: neg Musculoskeletal:neg Allergy/Immunology: neg Neurological: History of migraine headaches for 5 years, morning headaches Psychiatric: neg Sleep : neg   ALLERGIES: Allergies  Allergen Reactions  . Penicillins   . Vicodin [Hydrocodone-Acetaminophen]     HOME MEDICATIONS: Outpatient Medications Prior to Visit  Medication Sig Dispense Refill  . Multiple Vitamin (MULTIVITAMIN) capsule Take 1 capsule by mouth daily.      . ondansetron (ZOFRAN) 4 MG tablet Take 1 tablet (4 mg total) by mouth every 8 (eight) hours as needed for nausea or vomiting. 20 tablet 0  . pantoprazole (PROTONIX) 40 MG tablet Take 1 tablet (40 mg total) by mouth daily. 30 tablet 2  . tiZANidine (ZANAFLEX) 4 MG tablet Take 1 tablet (4 mg total) by mouth every 6 (six) hours as needed for muscle spasms. 60 tablet 6   Facility-Administered Medications Prior to Visit  Medication Dose Route Frequency Provider Last Rate Last Dose  .  0.9 %  sodium chloride infusion  500 mL Intravenous Continuous Danis, Henry L III, MD      . gadopentetate dimeglumine (MAGNEVIST) injection 20 mL  20 mL Intravenous Once PRN Melvenia Beam, MD        PAST MEDICAL HISTORY: Past Medical History:  Diagnosis Date  . Allergy    . Microscopic hematuria   . Migraine   . Urinary incontinence     PAST SURGICAL HISTORY: Past Surgical History:  Procedure Laterality Date  . CESAREAN SECTION    . EYE SURGERY      FAMILY HISTORY: Family History  Problem Relation Age of Onset  . Healthy Sister   . Stroke Father        also alocholic  . Colon cancer Neg Hx   . Esophageal cancer Neg Hx   . Pancreatic cancer Neg Hx   . Rectal cancer Neg Hx   . Stomach cancer Neg Hx     SOCIAL HISTORY: Social History   Socioeconomic History  . Marital status: Married    Spouse name: Not on file  . Number of children: Not on file  . Years of education: Not on file  . Highest education level: Not on file  Social Needs  . Financial resource strain: Not on file  . Food insecurity - worry: Not on file  . Food insecurity - inability: Not on file  . Transportation needs - medical: Not on file  . Transportation needs - non-medical: Not on file  Occupational History  . Not on file  Tobacco Use  . Smoking status: Former Smoker    Last attempt to quit: 03/02/1993    Years since quitting: 24.1  . Smokeless tobacco: Never Used  Substance and Sexual Activity  . Alcohol use: Yes    Alcohol/week: 2.0 oz    Types: 4 Standard drinks or equivalent per week  . Drug use: No  . Sexual activity: Not on file  Other Topics Concern  . Not on file  Social History Narrative   Caffeine very little .  Lives at home with husband, Brandi Berry.  One child.  [employer].  Works [employer].      PHYSICAL EXAM  There were no vitals filed for this visit. There is no height or weight on file to calculate BMI.  Generalized: Well developed, obese female in no acute distress  Head: normocephalic and atraumatic,. Oropharynx benign  Neck: Supple, paraspinals tender to palpation Cardiac: Regular rate rhythm, no murmur  Musculoskeletal: No deformity   Neurological examination   Mentation: Alert oriented to time, place, history taking.  Attention span and concentration appropriate. Recent and remote memory intact.  Follows all commands speech and language fluent.   Cranial nerve II-XII: Pupils were equal round reactive to light extraocular movements were full, visual field were full on confrontational test. Facial sensation and strength were normal. hearing was intact to finger rubbing bilaterally. Uvula tongue midline. head turning and shoulder shrug were normal and symmetric.Tongue protrusion into cheek strength was normal. Motor: normal bulk and tone, full strength in the BUE, BLE, fine finger movements normal, no pronator drift. No focal weakness Sensory: normal and symmetric to light touch, pinprick, and  Vibration, in the upper and lower extremities Coordination: finger-nose-finger, heel-to-shin bilaterally, no dysmetria, no tremor Reflexes: Symmetric upper and lower, plantar responses were flexor bilaterally. Gait and Station: Rising up from seated position without assistance, normal stance,  moderate stride, good arm swing, smooth turning, able to perform tiptoe,  and heel walking without difficulty. Tandem gait is steady  DIAGNOSTIC DATA (LABS, IMAGING, TESTING) - I reviewed patient records, labs, notes, testing and imaging myself where available.  Lab Results  Component Value Date   WBC 7.8 10/08/2016   HGB 14.8 10/08/2016   HCT 43.7 10/08/2016   MCV 92.8 10/08/2016   PLT 331 10/08/2016      Component Value Date/Time   NA 140 10/08/2016 1514   K 3.8 10/08/2016 1514   CL 104 10/08/2016 1514   CO2 24 10/08/2016 1514   GLUCOSE 113 (H) 10/08/2016 1514   BUN 13 10/08/2016 1514   CREATININE 0.74 10/08/2016 1514   CALCIUM 9.5 10/08/2016 1514   PROT 6.6 10/08/2016 1514   ALBUMIN 4.1 10/08/2016 1514   AST 16 10/08/2016 1514   ALT 9 10/08/2016 1514   ALKPHOS 98 10/08/2016 1514   BILITOT 0.4 10/08/2016 1514   GFRNONAA 87 10/08/2016 1514   GFRAA >89 10/08/2016 1514   Lab Results  Component Value Date   CHOL 219  (H) 09/30/2016   HDL 84 09/30/2016   LDLCALC 121 (H) 09/30/2016   TRIG 70 09/30/2016   CHOLHDL 2.6 09/30/2016    Lab Results  Component Value Date   TSH 1.93 09/30/2016      ASSESSMENT AND PLAN  62 y.o. year old female  has a past medical history of migraines for 4-5 years.  At one time she was taking a lot of Excedrin Migraine but that has stopped because of medication overuse.  She also complains with morning headaches.  She woke up with a headache today a 6-7 on a scale of 10 on the pain scale.  MRI of the brain10/24/2018 was unremarkable showing mild age-appropriate changes of chronic microvascular ischemia and prominent soft tissue in the posterior nasopharynx no significant change when compared to MRI dated October 24, 2009.  Will will set up for sleep study due to morning headaches Will hold off on preventive medication for now until results of sleep study are known IV Depacon and Solu-Medrol today for headache today, Showed  examples of neck exercises to perform several times daily since she is on a computer Follow-up in 3 months Dennie Bible, [facility], [facility], [facility] [address], [place], [place] [address] [phone]

## 2017-04-06 ENCOUNTER — Ambulatory Visit: Payer: 59 | Admitting: Nurse Practitioner

## 2017-04-06 ENCOUNTER — Telehealth: Payer: Self-pay

## 2017-04-06 NOTE — Telephone Encounter (Signed)
Patient no-show for appt

## 2017-04-07 ENCOUNTER — Encounter: Payer: Self-pay | Admitting: Nurse Practitioner

## 2017-04-27 NOTE — Progress Notes (Signed)
[facility]  PATIENT: Brandi Berry DOB: 05-31-1955   REASON FOR VISIT: Follow-up for migraines , morning headaches HISTORY FROM: Patient    HISTORY OF PRESENT ILLNESS:UPDATE 2/27/2019CM [name], 62 year old female returns for follow-up with history of migraines.  Her headaches  when they occur to be mostly in the morning on arising.  She also has some sinus issues that can lead to headache claims she has about 4 headaches per month.  She was asked to get a sleep study at her last visit however she has not followed through.  Her headache frequency is not enough for  preventive medication.  She has tried Topamax and tizanidine in the past.  Depacon infusion stopped her headache at her last visit along with prednisone.Patient claims that she sleeps well and she does not particularly remember waking up during the night.  She returns for reevaluation   UPDATE 11/1/2018CM [name], 62 year old female returns for follow-up.  She has a history of migraine headaches for 4-5 years.  When last seen she was taking a lot of over-the-counter preparations such as Excedrin Migraine.  She has stopped those.  She took a prednisone Dosepak which helped.  She has taken little of her tizanidine.  She returns for reevaluation with a headache today on a scale of 10 at a 6 or 7.  It is over the right eye  Area.  Patient claims she has been on topiramate in the past with little benefit.  On questioning when her headaches occur she wakes up with headaches many mornings.  She usually gets 6-7 hours of sleep.  Her headache in the morning may go away after a couple of hours but return in late afternoon.  MRI of the brain 12/23/2016 was unremarkable showing mild age-appropriate changes of chronic microvascular ischemia and prominent soft tissue in the posterior nasopharynx no significant change when compared to MRI dated October 24, 2009.  She has never had a sleep study.  She returns  for reevaluation   11/23/16 [name] is a 62 y.o. female here as a referral from Dr. Renold Genta for migraines. Past medical history of vitamin D deficiency, hyperlipidemia, migraines, recent stressors in her life. She has had migraines for 4-5 years. She examined her diet, no excessive caffeine, no medication overuse. Caffeine helps. She has been taking excedrin migraine (Tylenol, asa, caffeine) every other day as well as alka-seltzer for her headaches. Steroids help. She has been taking excedrin. Wine can trigger headaches. The migraines are above the right eye, throbbing,  She has 20 headache days a month and most are migrainous, smells will triger, strong perfumes, activity worsens the headache, the light doesn't bother her too much nor sound, she has nausea. Headaches lasts all day or days until she takes something. Some blurry vision, she gets flashes of lights sometimes before the migraines. No snoring or excessive daytime fatigue or morning headaches. She also takes alkaseltzer for her morning headaches. No other focal neurologic deficits, associated symptoms, inciting events or modifiable factors.   REVIEW OF SYSTEMS: Full 14 system review of systems performed and notable only for those listed, all others are neg:  Constitutional: neg  Cardiovascular: neg Ear/Nose/Throat: neg  Skin: neg Eyes: neg Respiratory: neg Gastroitestinal: neg  Hematology/Lymphatic: neg  Endocrine: neg Musculoskeletal:neg Allergy/Immunology: neg Neurological: History of migraine headaches for 5 years, morning headaches Psychiatric: neg Sleep : neg   ALLERGIES: Allergies  Allergen Reactions  . Penicillins   . Tizanidine Other (See Comments)  headaches  . Vicodin [Hydrocodone-Acetaminophen]     HOME MEDICATIONS: Outpatient Medications Prior to Visit  Medication Sig Dispense Refill  . Multiple Vitamin (MULTIVITAMIN) capsule Take 1 capsule by mouth daily.      . ondansetron (ZOFRAN) 4 MG  tablet Take 1 tablet (4 mg total) by mouth every 8 (eight) hours as needed for nausea or vomiting. 20 tablet 0  . pantoprazole (PROTONIX) 40 MG tablet Take 1 tablet (40 mg total) by mouth daily. 30 tablet 2  . tiZANidine (ZANAFLEX) 4 MG tablet Take 1 tablet (4 mg total) by mouth every 6 (six) hours as needed for muscle spasms. 60 tablet 6   Facility-Administered Medications Prior to Visit  Medication Dose Route Frequency Provider Last Rate Last Dose  . 0.9 %  sodium chloride infusion  500 mL Intravenous Continuous Danis, Henry L III, MD      . gadopentetate dimeglumine (MAGNEVIST) injection 20 mL  20 mL Intravenous Once PRN Melvenia Beam, MD        PAST MEDICAL HISTORY: Past Medical History:  Diagnosis Date  . Allergy   . Microscopic hematuria   . Migraine   . Urinary incontinence     PAST SURGICAL HISTORY: Past Surgical History:  Procedure Laterality Date  . CESAREAN SECTION    . EYE SURGERY      FAMILY HISTORY: Family History  Problem Relation Age of Onset  . Healthy Sister   . Stroke Father        also alocholic  . Colon cancer Neg Hx   . Esophageal cancer Neg Hx   . Pancreatic cancer Neg Hx   . Rectal cancer Neg Hx   . Stomach cancer Neg Hx     SOCIAL HISTORY: Social History   Socioeconomic History  . Marital status: Married    Spouse name: Not on file  . Number of children: Not on file  . Years of education: Not on file  . Highest education level: Not on file  Social Needs  . Financial resource strain: Not on file  . Food insecurity - worry: Not on file  . Food insecurity - inability: Not on file  . Transportation needs - medical: Not on file  . Transportation needs - non-medical: Not on file  Occupational History  . Not on file  Tobacco Use  . Smoking status: Former Smoker    Last attempt to quit: 03/02/1993    Years since quitting: 24.1  . Smokeless tobacco: Never Used  Substance and Sexual Activity  . Alcohol use: Yes    Alcohol/week: 2.0 oz     Types: 4 Standard drinks or equivalent per week  . Drug use: No  . Sexual activity: Not on file  Other Topics Concern  . Not on file  Social History Narrative   Caffeine very little .  Lives at home with husband, Percell Miller.  One child.  [employer].  Works [employer]:   04/28/17 1341  BP: (!) 132/80   Pulse: 66  Weight: 203 lb 6.4 oz (92.3 kg)  Height: 5\' 3"  (1.6 m)   Body mass index is 36.03 kg/m.  Generalized: Well developed, obese female in no acute distress  Head: normocephalic and atraumatic,. Oropharynx benign  Neck: Supple,  Cardiac: Regular rate rhythm, no murmur  Musculoskeletal: No deformity   Neurological examination   Mentation: Alert oriented to time, place, history taking. Attention span and concentration appropriate. Recent and remote memory  intact.  Follows all commands speech and language fluent.   Cranial nerve II-XII: Pupils were equal round reactive to light extraocular movements were full, visual field were full on confrontational test. Facial sensation and strength were normal. hearing was intact to finger rubbing bilaterally. Uvula tongue midline. head turning and shoulder shrug were normal and symmetric.Tongue protrusion into cheek strength was normal. Motor: normal bulk and tone, full strength in the BUE, BLE, fine finger movements normal, no pronator drift. No focal weakness Sensory: normal and symmetric to light touch, pinprick, and  Vibration, in the upper and lower extremities Coordination: finger-nose-finger, heel-to-shin bilaterally, no dysmetria, no tremor Reflexes: Symmetric upper and lower, plantar responses were flexor bilaterally. Gait and Station: Rising up from seated position without assistance, normal stance,  moderate stride, good arm swing, smooth turning, able to perform tiptoe, and heel walking without difficulty. Tandem gait is steady  DIAGNOSTIC DATA (LABS, IMAGING, TESTING) - I reviewed patient  records, labs, notes, testing and imaging myself where available.  Lab Results  Component Value Date   WBC 7.8 10/08/2016   HGB 14.8 10/08/2016   HCT 43.7 10/08/2016   MCV 92.8 10/08/2016   PLT 331 10/08/2016      Component Value Date/Time   NA 140 10/08/2016 1514   K 3.8 10/08/2016 1514   CL 104 10/08/2016 1514   CO2 24 10/08/2016 1514   GLUCOSE 113 (H) 10/08/2016 1514   BUN 13 10/08/2016 1514   CREATININE 0.74 10/08/2016 1514   CALCIUM 9.5 10/08/2016 1514   PROT 6.6 10/08/2016 1514   ALBUMIN 4.1 10/08/2016 1514   AST 16 10/08/2016 1514   ALT 9 10/08/2016 1514   ALKPHOS 98 10/08/2016 1514   BILITOT 0.4 10/08/2016 1514   GFRNONAA 87 10/08/2016 1514   GFRAA >89 10/08/2016 1514   Lab Results  Component Value Date   CHOL 219 (H) 09/30/2016   HDL 84 09/30/2016   LDLCALC 121 (H) 09/30/2016   TRIG 70 09/30/2016   CHOLHDL 2.6 09/30/2016    Lab Results  Component Value Date   TSH 1.93 09/30/2016      ASSESSMENT AND PLAN  62 y.o. year old female  has a past medical history of migraines for 4-5 years.  At one time she was taking a lot of Excedrin Migraine but that has stopped because of medication overuse.  She also complains with morning headaches.    MRI of the brain10/24/2018 was unremarkable showing mild age-appropriate changes of chronic microvascular ischemia and prominent soft tissue in the posterior nasopharynx no significant change when compared to MRI dated October 24, 2009.  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 Even type 2 diabetes has, in part, been linked to untreated OSA. Symptoms of untreated OSA include daytime sleepiness, memory problems, mood irritability and mood disorder such as depression and anxiety, lack of energy, as well as recurrent headaches, especially morning headaches.  Follow-up in 6 months or sooner if needed Dennie Bible, [facility], APRN  [facility] [address], [place], [place] [address] [phone]

## 2017-04-28 ENCOUNTER — Ambulatory Visit: Payer: 59 | Admitting: Nurse Practitioner

## 2017-04-28 ENCOUNTER — Encounter: Payer: Self-pay | Admitting: Nurse Practitioner

## 2017-04-28 VITALS — BP 132/80 | HR 66 | Ht 63.0 in | Wt 203.4 lb

## 2017-04-28 DIAGNOSIS — R519 Headache, unspecified: Secondary | ICD-10-CM

## 2017-04-28 DIAGNOSIS — R51 Headache: Secondary | ICD-10-CM

## 2017-04-28 DIAGNOSIS — R400 Somnolence: Secondary | ICD-10-CM | POA: Insufficient documentation

## 2017-04-28 DIAGNOSIS — G43809 Other migraine, not intractable, without status migrainosus: Secondary | ICD-10-CM | POA: Diagnosis not present

## 2017-04-28 NOTE — Patient Instructions (Signed)
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 Even type 2 diabetes has, in part, been linked to untreated OSA. Symptoms of untreated OSA include daytime sleepiness, memory problems, mood irritability and mood disorder such as depression and anxiety, lack of energy, as well as recurrent headaches, especially morning headaches.

## 2017-06-15 NOTE — Progress Notes (Signed)
Personally  participated in, made any corrections needed, and agree with history, physical, neuro exam,assessment and plan as stated above.    Antonia Ahern, MD [facility] 

## 2017-08-31 DIAGNOSIS — H35341 Macular cyst, hole, or pseudohole, right eye: Secondary | ICD-10-CM | POA: Diagnosis not present

## 2017-10-13 ENCOUNTER — Other Ambulatory Visit: Payer: Self-pay | Admitting: Internal Medicine

## 2017-10-13 DIAGNOSIS — E7800 Pure hypercholesterolemia, unspecified: Secondary | ICD-10-CM

## 2017-10-13 DIAGNOSIS — E559 Vitamin D deficiency, unspecified: Secondary | ICD-10-CM

## 2017-10-13 DIAGNOSIS — Z0000 Encounter for general adult medical examination without abnormal findings: Secondary | ICD-10-CM

## 2017-10-13 DIAGNOSIS — Z8709 Personal history of other diseases of the respiratory system: Secondary | ICD-10-CM

## 2017-10-13 DIAGNOSIS — Z8669 Personal history of other diseases of the nervous system and sense organs: Secondary | ICD-10-CM

## 2017-10-13 DIAGNOSIS — F439 Reaction to severe stress, unspecified: Secondary | ICD-10-CM

## 2017-10-18 ENCOUNTER — Other Ambulatory Visit: Payer: 59 | Admitting: Internal Medicine

## 2017-10-18 DIAGNOSIS — Z0000 Encounter for general adult medical examination without abnormal findings: Secondary | ICD-10-CM

## 2017-10-18 DIAGNOSIS — Z8669 Personal history of other diseases of the nervous system and sense organs: Secondary | ICD-10-CM

## 2017-10-18 DIAGNOSIS — E7800 Pure hypercholesterolemia, unspecified: Secondary | ICD-10-CM

## 2017-10-18 DIAGNOSIS — E559 Vitamin D deficiency, unspecified: Secondary | ICD-10-CM

## 2017-10-18 DIAGNOSIS — Z8709 Personal history of other diseases of the respiratory system: Secondary | ICD-10-CM

## 2017-10-18 DIAGNOSIS — F439 Reaction to severe stress, unspecified: Secondary | ICD-10-CM

## 2017-10-18 LAB — COMPLETE METABOLIC PANEL WITH GFR
AG Ratio: 1.9 (calc) (ref 1.0–2.5)
ALT: 9 U/L (ref 6–29)
AST: 16 U/L (ref 10–35)
Albumin: 4.2 g/dL (ref 3.6–5.1)
Alkaline phosphatase (APISO): 86 U/L (ref 33–130)
BUN: 13 mg/dL (ref 7–25)
CALCIUM: 9.3 mg/dL (ref 8.6–10.4)
CO2: 28 mmol/L (ref 20–32)
CREATININE: 0.72 mg/dL (ref 0.50–0.99)
Chloride: 107 mmol/L (ref 98–110)
GFR, EST NON AFRICAN AMERICAN: 88 mL/min/{1.73_m2} (ref >=60)
GFR, Est African American: 103 mL/min/{1.73_m2} (ref >=60)
GLUCOSE: 85 mg/dL (ref 65–99)
Globulin: 2.2 g/dL (calc) (ref 1.9–3.7)
Potassium: 4.8 mmol/L (ref 3.5–5.3)
Sodium: 143 mmol/L (ref 135–146)
Total Bilirubin: 0.6 mg/dL (ref 0.2–1.2)
Total Protein: 6.4 g/dL (ref 6.1–8.1)

## 2017-10-18 LAB — CBC WITH DIFFERENTIAL/PLATELET
Basophils Absolute: 38 cells/uL (ref 0–200)
Basophils Relative: 0.8 %
EOS ABS: 108 {cells}/uL (ref 15–500)
Eosinophils Relative: 2.3 %
HEMATOCRIT: 41.6 % (ref 35.0–45.0)
Hemoglobin: 14 g/dL (ref 11.7–15.5)
LYMPHS ABS: 1105 {cells}/uL (ref 850–3900)
MCH: 30.7 pg (ref 27.0–33.0)
MCHC: 33.7 g/dL (ref 32.0–36.0)
MCV: 91.2 fL (ref 80.0–100.0)
MPV: 10 fL (ref 7.5–12.5)
Monocytes Relative: 8.7 %
Neutro Abs: 3041 cells/uL (ref 1500–7800)
Neutrophils Relative %: 64.7 %
Platelets: 277 10*3/uL (ref 140–400)
RBC: 4.56 10*6/uL (ref 3.80–5.10)
RDW: 12.2 % (ref 11.0–15.0)
Total Lymphocyte: 23.5 %
WBC: 4.7 10*3/uL (ref 3.8–10.8)
WBCMIX: 409 {cells}/uL (ref 200–950)

## 2017-10-18 LAB — TSH: TSH: 2.33 m[IU]/L (ref 0.40–4.50)

## 2017-10-18 LAB — LIPID PANEL
CHOLESTEROL: 255 mg/dL — AB (ref <200)
HDL: 76 mg/dL (ref >50)
LDL Cholesterol (Calc): 163 mg/dL (calc) — ABNORMAL HIGH
Non-HDL Cholesterol (Calc): 179 mg/dL (calc) — ABNORMAL HIGH (ref <130)
Total CHOL/HDL Ratio: 3.4 (calc) (ref <5.0)
Triglycerides: 61 mg/dL (ref <150)

## 2017-10-18 LAB — VITAMIN D 25 HYDROXY (VIT D DEFICIENCY, FRACTURES): VIT D 25 HYDROXY: 31 ng/mL (ref 30–100)

## 2017-10-19 ENCOUNTER — Encounter: Payer: Self-pay | Admitting: Internal Medicine

## 2017-10-19 ENCOUNTER — Ambulatory Visit (INDEPENDENT_AMBULATORY_CARE_PROVIDER_SITE_OTHER): Payer: 59 | Admitting: Internal Medicine

## 2017-10-19 VITALS — BP 102/70 | HR 71 | Ht 63.0 in | Wt 202.0 lb

## 2017-10-19 DIAGNOSIS — R350 Frequency of micturition: Secondary | ICD-10-CM

## 2017-10-19 DIAGNOSIS — E7800 Pure hypercholesterolemia, unspecified: Secondary | ICD-10-CM | POA: Diagnosis not present

## 2017-10-19 DIAGNOSIS — Z8669 Personal history of other diseases of the nervous system and sense organs: Secondary | ICD-10-CM | POA: Diagnosis not present

## 2017-10-19 DIAGNOSIS — Z0000 Encounter for general adult medical examination without abnormal findings: Secondary | ICD-10-CM | POA: Diagnosis not present

## 2017-10-19 DIAGNOSIS — N898 Other specified noninflammatory disorders of vagina: Secondary | ICD-10-CM

## 2017-10-19 LAB — POCT URINALYSIS DIPSTICK
APPEARANCE: NORMAL
BILIRUBIN UA: NEGATIVE
GLUCOSE UA: NEGATIVE
Ketones, UA: NEGATIVE
LEUKOCYTES UA: NEGATIVE
Nitrite, UA: NEGATIVE
Odor: NORMAL
PH UA: 6 (ref 5.0–8.0)
Protein, UA: NEGATIVE
SPEC GRAV UA: 1.015 (ref 1.010–1.025)
UROBILINOGEN UA: 0.2 U/dL

## 2017-10-19 MED ORDER — TERCONAZOLE 0.4 % VA CREA: 1.0000 | TOPICAL_CREAM | Freq: Every day | VAGINAL | 1 refills | Status: DC

## 2017-10-19 MED ORDER — PREDNISONE 10 MG PO TABS: ORAL_TABLET | ORAL | 1 refills | Status: DC

## 2017-10-19 NOTE — Progress Notes (Signed)
Subjective:    Patient ID: Brandi Berry, female    DOB: 03-24-1955, 62 y.o.   MRN: [id number]  HPI 62 year old Female for health maintenance exam and evaluation of medical issues. Recently, lost husband to lung  cancer.  Has urinary frequency. Has tried Detrol, Engineer, mining and they did not work all that well and caused dry mouth. Refer to urology for evaluation Urology saw her in 2010 for mixed stress and urge urinary incontinence.  History of benign microscopic hematuria.    History of migraine headaches.If she has protracted migraine, generally it takes prednisone to resolve.Saw Dr. Jaynee Eagles in 2018. MRI showed scattered white matter hyperintensities sometimes seen in migraine and small vessel disease.  No significant change from MRI 2011.  They have recommended a sleep study.  She has daytime drowsiness.  Neck exercises were demonstrated to her by neurologist.  She is allergic to PCN  Social history: Formally smoked less than 1 pack of cigarettes daily include in the 1990s.  Social alcohol consumption.  She has a Scientist, water quality.  One son.  Unfortunately he has a substance abuse disorder and currently is incarcerated in [place].  She works 2 jobs one at [employer] and the other at [employer] at [employer].  Allergy skin testing in 2004 revealed positive test to molds.  Left ectopic pregnancy 1996.  Dr. Kelby Fam office is GYN.  Saw a podiatrist in 2005 records and hammertoe deformity.  Family history: Sister and mother living.  Father deceased.  Father had history of stroke and was an alcoholic.    Review of Systems  Constitutional: Positive for fatigue.  Respiratory: Negative.   Cardiovascular: Negative.   Gastrointestinal: Negative.   Genitourinary:       Urinary frequency with stress and urge incontinence  Allergic/Immunologic: Positive for environmental allergies.  Neurological:       History of migraine headaches       Objective:   Physical  Exam  Constitutional: She is oriented to person, place, and time. She appears well-developed and well-nourished. No distress.  HENT:  Head: Normocephalic and atraumatic.  Right Ear: External ear normal.  Left Ear: External ear normal.  Mouth/Throat: Oropharynx is clear and moist. No oropharyngeal exudate.  Eyes: Pupils are equal, round, and reactive to light. Conjunctivae and EOM are normal. Right eye exhibits no discharge. Left eye exhibits no discharge. No scleral icterus.  Neck: Neck supple. No JVD present. No thyromegaly present.  Cardiovascular: Normal rate, regular rhythm, normal heart sounds and intact distal pulses.  No murmur heard. Pulmonary/Chest: Effort normal and breath sounds normal. No respiratory distress. She has no wheezes. She has no rales.  Abdominal: Soft. Bowel sounds are normal. She exhibits no distension and no mass. There is no tenderness. There is no rebound and no guarding.  Genitourinary:  Genitourinary Comments: Deferred to GYN  Musculoskeletal: She exhibits no edema.  Lymphadenopathy:    She has no cervical adenopathy.  Neurological: She is alert and oriented to person, place, and time. No cranial nerve deficit. Coordination normal.  Skin: Skin is warm and dry. She is not diaphoretic.  Psychiatric: She has a normal mood and affect. Her behavior is normal. Thought content normal.  Vitals reviewed.         Assessment & Plan:  Urinary frequency- did not like med because of dry mouth-referral once again to urology.  Has tried several drugs  Vaginal itching- Rx Terazol vaginal cream qhs x 7 days  Health maintenance- mammogram  ordered. Had colonoscopy. Shingles vaccine Rx given  Hyperlipidemia- recheck in 3 months- does not want statin yet  Hx migraine headaches-seen by [facility]  Grief reaction secondary to loss of husband  Plan: Recheck lipid panel in 3 months.  Refer to urology.  Have mammogram.

## 2017-10-20 ENCOUNTER — Other Ambulatory Visit: Payer: Self-pay | Admitting: Internal Medicine

## 2017-10-20 DIAGNOSIS — Z1231 Encounter for screening mammogram for malignant neoplasm of breast: Secondary | ICD-10-CM

## 2017-10-25 NOTE — Progress Notes (Deleted)
[facility]  PATIENT: Brandi Berry DOB: 05-31-1955   REASON FOR VISIT: Follow-up for migraines , morning headaches HISTORY FROM: Patient    HISTORY OF PRESENT ILLNESS:UPDATE 2/27/2019CM Brandi Berry, 62 year old female returns for follow-up with history of migraines.  Her headaches  when they occur to be mostly in the morning on arising.  She also has some sinus issues that can lead to headache claims she has about 4 headaches per month.  She was asked to get a sleep study at her last visit however she has not followed through.  Her headache frequency is not enough for  preventive medication.  She has tried Topamax and tizanidine in the past.  Depacon infusion stopped her headache at her last visit along with prednisone.Patient claims that she sleeps well and she does not particularly remember waking up during the night.  She returns for reevaluation   UPDATE 11/1/2018CM Brandi Berry, 62 year old female returns for follow-up.  She has a history of migraine headaches for 4-5 years.  When last seen she was taking a lot of over-the-counter preparations such as Excedrin Migraine.  She has stopped those.  She took a prednisone Dosepak which helped.  She has taken little of her tizanidine.  She returns for reevaluation with a headache today on a scale of 10 at a 6 or 7.  It is over the right eye  Area.  Patient claims she has been on topiramate in the past with little benefit.  On questioning when her headaches occur she wakes up with headaches many mornings.  She usually gets 6-7 hours of sleep.  Her headache in the morning may go away after a couple of hours but return in late afternoon.  MRI of the brain 12/23/2016 was unremarkable showing mild age-appropriate changes of chronic microvascular ischemia and prominent soft tissue in the posterior nasopharynx no significant change when compared to MRI dated October 24, 2009.  She has never had a sleep study.  She returns  for reevaluation   11/23/16 [name] is a 62 y.o. female here as a referral from Dr. Renold Genta for migraines. Past medical history of vitamin D deficiency, hyperlipidemia, migraines, recent stressors in her life. She has had migraines for 4-5 years. She examined her diet, no excessive caffeine, no medication overuse. Caffeine helps. She has been taking excedrin migraine (Tylenol, asa, caffeine) every other day as well as alka-seltzer for her headaches. Steroids help. She has been taking excedrin. Wine can trigger headaches. The migraines are above the right eye, throbbing,  She has 20 headache days a month and most are migrainous, smells will triger, strong perfumes, activity worsens the headache, the light doesn't bother her too much nor sound, she has nausea. Headaches lasts all day or days until she takes something. Some blurry vision, she gets flashes of lights sometimes before the migraines. No snoring or excessive daytime fatigue or morning headaches. She also takes alkaseltzer for her morning headaches. No other focal neurologic deficits, associated symptoms, inciting events or modifiable factors.   REVIEW OF SYSTEMS: Full 14 system review of systems performed and notable only for those listed, all others are neg:  Constitutional: neg  Cardiovascular: neg Ear/Nose/Throat: neg  Skin: neg Eyes: neg Respiratory: neg Gastroitestinal: neg  Hematology/Lymphatic: neg  Endocrine: neg Musculoskeletal:neg Allergy/Immunology: neg Neurological: History of migraine headaches for 5 years, morning headaches Psychiatric: neg Sleep : neg   ALLERGIES: Allergies  Allergen Reactions  . Penicillins   . Tizanidine Other (See Comments)  headaches  . Vicodin [Hydrocodone-Acetaminophen]     HOME MEDICATIONS: Outpatient Medications Prior to Visit  Medication Sig Dispense Refill  . Multiple Vitamin (MULTIVITAMIN) capsule Take 1 capsule by mouth daily.      . ondansetron (ZOFRAN) 4 MG  tablet Take 1 tablet (4 mg total) by mouth every 8 (eight) hours as needed for nausea or vomiting. (Patient not taking: Reported on 10/19/2017) 20 tablet 0  . pantoprazole (PROTONIX) 40 MG tablet Take 1 tablet (40 mg total) by mouth daily. (Patient not taking: Reported on 10/19/2017) 30 tablet 2  . predniSONE (DELTASONE) 10 MG tablet Take in tapering course as directed 6-5-4-3-2-1 21 tablet 1  . terconazole (TERAZOL 7) 0.4 % vaginal cream Place 1 applicator vaginally at bedtime. 45 g 1   No facility-administered medications prior to visit.     PAST MEDICAL HISTORY: Past Medical History:  Diagnosis Date  . Allergy   . Microscopic hematuria   . Migraine   . Urinary incontinence     PAST SURGICAL HISTORY: Past Surgical History:  Procedure Laterality Date  . CESAREAN SECTION    . EYE SURGERY      FAMILY HISTORY: Family History  Problem Relation Age of Onset  . Healthy Sister   . Stroke Father        also alocholic  . Colon cancer Neg Hx   . Esophageal cancer Neg Hx   . Pancreatic cancer Neg Hx   . Rectal cancer Neg Hx   . Stomach cancer Neg Hx     SOCIAL HISTORY: Social History   Socioeconomic History  . Marital status: Married    Spouse name: Not on file  . Number of children: Not on file  . Years of education: Not on file  . Highest education level: Not on file  Occupational History  . Not on file  Social Needs  . Financial resource strain: Not on file  . Food insecurity:    Worry: Not on file    Inability: Not on file  . Transportation needs:    Medical: Not on file    Non-medical: Not on file  Tobacco Use  . Smoking status: Former Smoker    Last attempt to quit: 03/02/1993    Years since quitting: 24.6  . Smokeless tobacco: Never Used  Substance and Sexual Activity  . Alcohol use: Yes    Alcohol/week: 4.0 standard drinks    Types: 4 Standard drinks or equivalent per week  . Drug use: No  . Sexual activity: Not on file  Lifestyle  . Physical activity:     Days per week: Not on file    Minutes per session: Not on file  . Stress: Not on file  Relationships  . Social connections:    Talks on phone: Not on file    Gets together: Not on file    Attends religious service: Not on file    Active member of club or organization: Not on file    Attends meetings of clubs or organizations: Not on file    Relationship status: Not on file  . Intimate partner violence:    Fear of current or ex partner: Not on file    Emotionally abused: Not on file    Physically abused: Not on file    Forced sexual activity: Not on file  Other Topics Concern  . Not on file  Social History Narrative   Caffeine very little .  Lives at home with husband, Percell Miller.  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  Works [employer]:   04/28/17 1341  BP: (!) 132/80   Pulse: 66  Weight: 203 lb 6.4 oz (92.3 kg)  Height: 5\' 3"  (1.6 m)   There is no height or weight on file to calculate BMI.  Generalized: Well developed, obese female in no acute distress  Head: normocephalic and atraumatic,. Oropharynx benign  Neck: Supple,  Cardiac: Regular rate rhythm, no murmur  Musculoskeletal: No deformity   Neurological examination   Mentation: Alert oriented to time, place, history taking. Attention span and concentration appropriate. Recent and remote memory intact.  Follows all commands speech and language fluent.   Cranial nerve II-XII: Pupils were equal round reactive to light extraocular movements were full, visual field were full on confrontational test. Facial sensation and strength were normal. hearing was intact to finger rubbing bilaterally. Uvula tongue midline. head turning and shoulder shrug were normal and symmetric.Tongue protrusion into cheek strength was normal. Motor: normal bulk and tone, full strength in the BUE, BLE, fine finger movements normal, no pronator drift. No focal weakness Sensory: normal and symmetric to light touch, pinprick, and   Vibration, in the upper and lower extremities Coordination: finger-nose-finger, heel-to-shin bilaterally, no dysmetria, no tremor Reflexes: Symmetric upper and lower, plantar responses were flexor bilaterally. Gait and Station: Rising up from seated position without assistance, normal stance,  moderate stride, good arm swing, smooth turning, able to perform tiptoe, and heel walking without difficulty. Tandem gait is steady  DIAGNOSTIC DATA (LABS, IMAGING, TESTING) - I reviewed patient records, labs, notes, testing and imaging myself where available.  Lab Results  Component Value Date   WBC 4.7 10/18/2017   HGB 14.0 10/18/2017   HCT 41.6 10/18/2017   MCV 91.2 10/18/2017   PLT 277 10/18/2017      Component Value Date/Time   NA 143 10/18/2017 1106   K 4.8 10/18/2017 1106   CL 107 10/18/2017 1106   CO2 28 10/18/2017 1106   GLUCOSE 85 10/18/2017 1106   BUN 13 10/18/2017 1106   CREATININE 0.72 10/18/2017 1106   CALCIUM 9.3 10/18/2017 1106   PROT 6.4 10/18/2017 1106   ALBUMIN 4.1 10/08/2016 1514   AST 16 10/18/2017 1106   ALT 9 10/18/2017 1106   ALKPHOS 98 10/08/2016 1514   BILITOT 0.6 10/18/2017 1106   GFRNONAA 88 10/18/2017 1106   GFRAA 103 10/18/2017 1106   Lab Results  Component Value Date   CHOL 255 (H) 10/18/2017   HDL 76 10/18/2017   LDLCALC 163 (H) 10/18/2017   TRIG 61 10/18/2017   CHOLHDL 3.4 10/18/2017    Lab Results  Component Value Date   TSH 2.33 10/18/2017      ASSESSMENT AND PLAN  62 y.o. year old female  has a past medical history of migraines for 4-5 years.  At one time she was taking a lot of Excedrin Migraine but that has stopped because of medication overuse.  She also complains with morning headaches.    MRI of the brain10/24/2018 was unremarkable showing mild age-appropriate changes of chronic microvascular ischemia and prominent soft tissue in the posterior nasopharynx no significant change when compared to MRI dated October 24, 2009.  PLAN Once  again discussed sleep study obesity  due to morning headaches, daytime drowsiness,  etc Given list of foods that are migraine triggers eliminate one at a time if problematic Neck exercises to perform several times daily since she is on a  computer I explained in particular the risks and ramifications of untreated moderate to severe OSA, especially with respect to cardiovascular disease  including congestive heart failure, difficult to treat hypertension, cardiac arrhythmias, or stroke. Even type 2 diabetes has, in part, been linked to untreated OSA. Symptoms of untreated OSA include daytime sleepiness, memory problems, mood irritability and mood disorder such as depression and anxiety, lack of energy, as well as recurrent headaches, especially morning headaches.  Follow-up in 6 months or sooner if needed Dennie Bible, [facility], [facility], APRN  [facility] [address], [place], [place] [address] [phone]

## 2017-10-26 ENCOUNTER — Ambulatory Visit: Payer: 59 | Admitting: Nurse Practitioner

## 2017-10-26 ENCOUNTER — Encounter: Payer: Self-pay | Admitting: Nurse Practitioner

## 2017-10-30 ENCOUNTER — Encounter: Payer: Self-pay | Admitting: Internal Medicine

## 2017-10-30 NOTE — Patient Instructions (Signed)
So sorry to hear of your husband's passing.  Return in 3 months to have lipid panel rechecked.  Watch diet and try to exercise.  Urology referral for urinary issues.  Terazol 7 vaginal cream for vaginal itching.  Please have mammogram.

## 2017-11-04 ENCOUNTER — Ambulatory Visit: Payer: 59 | Admitting: Nurse Practitioner

## 2017-11-04 ENCOUNTER — Encounter: Payer: Self-pay | Admitting: Nurse Practitioner

## 2017-11-04 VITALS — BP 122/73 | HR 71 | Ht 63.0 in | Wt 202.8 lb

## 2017-11-04 DIAGNOSIS — G43809 Other migraine, not intractable, without status migrainosus: Secondary | ICD-10-CM

## 2017-11-04 NOTE — Progress Notes (Signed)
[facility]  PATIENT: Brandi Berry DOB: 05-02-1955   REASON FOR VISIT: Follow-up for migraines  HISTORY FROM: Patient    HISTORY OF PRESENT ILLNESS:UPDATE 9/5/2019CM Ms. [address], 62 year old female returns for follow-up with history of migraines.  Her migraine triggers are barometric pressure.  She feels her migraines are in good control at present.  She has not followed up or sleep study and does not intend to.  Her headache frequency is not enough for a preventive medication.  She has failed Topamax and tizanidine in the past.  Patient relates that she recently lost her husband about 2 weeks ago.  She was encouraged to get bereavement therapy.  She was also encouraged to get into an exercise routine.  She continues to work part-time.  She returns for reevaluation   UPDATE 2/27/2019CM [name], 62 year old female returns for follow-up with history of migraines.  Her headaches  when they occur to be mostly in the morning on arising.  She also has some sinus issues that can lead to headache claims she has about 4 headaches per month.  She was asked to get a sleep study at her last visit however she has not followed through.  Her headache frequency is not enough for  preventive medication.  She has tried Topamax and tizanidine in the past.  Depacon infusion stopped her headache at her last visit along with prednisone.Patient claims that she sleeps well and she does not particularly remember waking up during the night.  She returns for reevaluation   UPDATE 11/1/2018CM Ms. [address], 62 year old female returns for follow-up.  She has a history of migraine headaches for 4-5 years.  When last seen she was taking a lot of over-the-counter preparations such as Excedrin Migraine.  She has stopped those.  She took a prednisone Dosepak which helped.  She has taken little of her tizanidine.  She returns for reevaluation with a headache today on a scale of 10 at a  6 or 7.  It is over the right eye  Area.  Patient claims she has been on topiramate in the past with little benefit.  On questioning when her headaches occur she wakes up with headaches many mornings.  She usually gets 6-7 hours of sleep.  Her headache in the morning may go away after a couple of hours but return in late afternoon.  MRI of the brain 12/23/2016 was unremarkable showing mild age-appropriate changes of chronic microvascular ischemia and prominent soft tissue in the posterior nasopharynx no significant change when compared to MRI dated October 24, 2009.  She has never had a sleep study.  She returns for reevaluation   11/23/16 [name] is a 62 y.o. female here as a referral from Dr. Renold Genta for migraines. Past medical history of vitamin D deficiency, hyperlipidemia, migraines, recent stressors in her life. She has had migraines for 4-5 years. She examined her diet, no excessive caffeine, no medication overuse. Caffeine helps. She has been taking excedrin migraine (Tylenol, asa, caffeine) every other day as well as alka-seltzer for her headaches. Steroids help. She has been taking excedrin. Wine can trigger headaches. The migraines are above the right eye, throbbing,  She has 20 headache days a month and most are migrainous, smells will triger, strong perfumes, activity worsens the headache, the light doesn't bother her too much nor sound, she has nausea. Headaches lasts all day or days until she takes something. Some blurry vision, she gets flashes of lights sometimes before the migraines.  No snoring or excessive daytime fatigue or morning headaches. She also takes alkaseltzer for her morning headaches. No other focal neurologic deficits, associated symptoms, inciting events or modifiable factors.   REVIEW OF SYSTEMS: Full 14 system review of systems performed and notable only for those listed, all others are neg:  Constitutional: neg  Cardiovascular: neg Ear/Nose/Throat: neg    Skin: neg Eyes: neg Respiratory: neg Gastroitestinal: neg  Hematology/Lymphatic: neg  Endocrine: neg Musculoskeletal:neg Allergy/Immunology: neg Neurological: History of migraine headaches for 5.5 years,  Psychiatric: neg Sleep : neg   ALLERGIES: Allergies  Allergen Reactions  . Penicillins   . Tizanidine Other (See Comments)    headaches  . Vicodin [Hydrocodone-Acetaminophen]     HOME MEDICATIONS: Outpatient Medications Prior to Visit  Medication Sig Dispense Refill  . Multiple Vitamin (MULTIVITAMIN) capsule Take 1 capsule by mouth daily.      . ondansetron (ZOFRAN) 4 MG tablet Take 1 tablet (4 mg total) by mouth every 8 (eight) hours as needed for nausea or vomiting. 20 tablet 0  . pantoprazole (PROTONIX) 40 MG tablet Take 1 tablet (40 mg total) by mouth daily. 30 tablet 2  . terconazole (TERAZOL 7) 0.4 % vaginal cream Place 1 applicator vaginally at bedtime. 45 g 1  . predniSONE (DELTASONE) 10 MG tablet Take in tapering course as directed 6-5-4-3-2-1 (Patient not taking: Reported on 11/04/2017) 21 tablet 1   No facility-administered medications prior to visit.     PAST MEDICAL HISTORY: Past Medical History:  Diagnosis Date  . Allergy   . Microscopic hematuria   . Migraine   . Urinary incontinence     PAST SURGICAL HISTORY: Past Surgical History:  Procedure Laterality Date  . CESAREAN SECTION    . EYE SURGERY      FAMILY HISTORY: Family History  Problem Relation Age of Onset  . Healthy Sister   . Stroke Father        also alocholic  . Colon cancer Neg Hx   . Esophageal cancer Neg Hx   . Pancreatic cancer Neg Hx   . Rectal cancer Neg Hx   . Stomach cancer Neg Hx     SOCIAL HISTORY: Social History   Socioeconomic History  . Marital status: Married    Spouse name: Not on file  . Number of children: Not on file  . Years of education: Not on file  . Highest education level: Not on file  Occupational History  . Not on file  Social Needs  .  Financial resource strain: Not on file  . Food insecurity:    Worry: Not on file    Inability: Not on file  . Transportation needs:    Medical: Not on file    Non-medical: Not on file  Tobacco Use  . Smoking status: Former Smoker    Last attempt to quit: 03/02/1993    Years since quitting: 24.6  . Smokeless tobacco: Never Used  Substance and Sexual Activity  . Alcohol use: Yes    Alcohol/week: 4.0 standard drinks    Types: 4 Standard drinks or equivalent per week  . Drug use: No  . Sexual activity: Not on file  Lifestyle  . Physical activity:    Days per week: Not on file    Minutes per session: Not on file  . Stress: Not on file  Relationships  . Social connections:    Talks on phone: Not on file    Gets together: Not on file    Attends religious  service: Not on file    Active member of club or organization: Not on file    Attends meetings of clubs or organizations: Not on file    Relationship status: Not on file  . Intimate partner violence:    Fear of current or ex partner: Not on file    Emotionally abused: Not on file    Physically abused: Not on file    Forced sexual activity: Not on file  Other Topics Concern  . Not on file  Social History Narrative   Caffeine very little .  Lives at home alone.   One child.  [employer].  Works [employer]:   04/28/17 1341  BP: (!) 132/80   Pulse: 66  Weight: 203 lb 6.4 oz (92.3 kg)  Height: 5\' 3"  (1.6 m)   Body mass index is 35.92 kg/m.  Generalized: Well developed, obese female in no acute distress  Head: normocephalic and atraumatic,. Oropharynx benign  Neck: Supple,  Cardiac: Regular rate rhythm, no murmur  Musculoskeletal: No deformity   Neurological examination   Mentation: Alert oriented to time, place, history taking. Attention span and concentration appropriate. Recent and remote memory intact.  Follows all commands speech and language fluent.   Cranial nerve II-XII:  Pupils were equal round reactive to light extraocular movements were full, visual field were full on confrontational test. Facial sensation and strength were normal. hearing was intact to finger rubbing bilaterally. Uvula tongue midline. head turning and shoulder shrug were normal and symmetric.Tongue protrusion into cheek strength was normal. Motor: normal bulk and tone, full strength in the BUE, BLE, Sensory: normal and symmetric to light touch, in the upper and lower extremities Coordination: finger-nose-finger, heel-to-shin bilaterally, no dysmetria, no tremor Reflexes: Symmetric upper and lower, plantar responses were flexor bilaterally. Gait and Station: Rising up from seated position without assistance, normal stance,  moderate stride, good arm swing, smooth turning, able to perform tiptoe, and heel walking without difficulty. Tandem gait is steady  DIAGNOSTIC DATA (LABS, IMAGING, TESTING) - I reviewed patient records, labs, notes, testing and imaging myself where available.  Lab Results  Component Value Date   WBC 4.7 10/18/2017   HGB 14.0 10/18/2017   HCT 41.6 10/18/2017   MCV 91.2 10/18/2017   PLT 277 10/18/2017      Component Value Date/Time   NA 143 10/18/2017 1106   K 4.8 10/18/2017 1106   CL 107 10/18/2017 1106   CO2 28 10/18/2017 1106   GLUCOSE 85 10/18/2017 1106   BUN 13 10/18/2017 1106   CREATININE 0.72 10/18/2017 1106   CALCIUM 9.3 10/18/2017 1106   PROT 6.4 10/18/2017 1106   ALBUMIN 4.1 10/08/2016 1514   AST 16 10/18/2017 1106   ALT 9 10/18/2017 1106   ALKPHOS 98 10/08/2016 1514   BILITOT 0.6 10/18/2017 1106   GFRNONAA 88 10/18/2017 1106   GFRAA 103 10/18/2017 1106   Lab Results  Component Value Date   CHOL 255 (H) 10/18/2017   HDL 76 10/18/2017   LDLCALC 163 (H) 10/18/2017   TRIG 61 10/18/2017   CHOLHDL 3.4 10/18/2017    Lab Results  Component Value Date   TSH 2.33 10/18/2017      ASSESSMENT AND PLAN  62 y.o. year old female  has a past  medical history of migraines for 5.5 years.  At one time she was taking a lot of Excedrin Migraine but that has stopped because of medication overuse.  She also complains with morning headaches.  She never followed through for sleep study   MRI of the brain10/24/2018 was unremarkable showing mild age-appropriate changes of chronic microvascular ischemia and prominent soft tissue in the posterior nasopharynx no significant change when compared to MRI dated 11/14/2009.  She says her husband died 2 weeks ago.  PLAN recommend bereavement therapy for the recent loss of her husband Continue neck exercises to perform several times daily since she is on a computer Keep a record of headaches on migraine tracker if they worsen She can take over-the-counter riboflavin or magnesium with food Follow-up in 8 months Dennie Bible, [facility], [facility] [address] [place], [place] [address] [phone]

## 2017-11-04 NOTE — Progress Notes (Signed)
made any corrections needed, and agree with history, physical, neuro exam,assessment and plan as stated above.     Sarina Ill, MD [facility]

## 2017-11-04 NOTE — Patient Instructions (Signed)
recommend bereavement therapy for the recent loss of her husband Continue exercise protocol walking etc. ContinueNeck exercises to perform several times daily since she is on a computer Keep a record of headaches on migraine tracker Follow-up in 8 months

## 2017-11-11 ENCOUNTER — Ambulatory Visit (INDEPENDENT_AMBULATORY_CARE_PROVIDER_SITE_OTHER): Payer: 59 | Admitting: Internal Medicine

## 2017-11-11 ENCOUNTER — Encounter: Payer: Self-pay | Admitting: Internal Medicine

## 2017-11-11 VITALS — BP 130/70 | HR 74 | Temp 98.3°F | Ht 63.0 in | Wt 199.0 lb

## 2017-11-11 DIAGNOSIS — L299 Pruritus, unspecified: Secondary | ICD-10-CM | POA: Diagnosis not present

## 2017-11-11 MED ORDER — ALPRAZOLAM 0.25 MG PO TABS: 0.2500 mg | ORAL_TABLET | Freq: Two times a day (BID) | ORAL | 0 refills | Status: DC | PRN

## 2017-11-11 MED ORDER — METHYLPREDNISOLONE ACETATE 80 MG/ML IJ SUSP
80.0000 mg | Freq: Once | INTRAMUSCULAR | Status: AC
  Administered 2017-11-11: 80 mg via INTRAMUSCULAR

## 2017-11-11 NOTE — Patient Instructions (Signed)
Stay with Claritin. Take Zantac 150 mg bid. Take Xanax at bedtime. Take tapering course of prednisone. Depomedol IM given.

## 2017-11-11 NOTE — Progress Notes (Signed)
   Subjective:    Patient ID: Brandi Berry, female    DOB: Apr 10, 1955, 62 y.o.   MRN: [id number]  HPI 62 year old Black Female who recently lost her husband to lung cancer is in today with complaining of generalized itching.  She has not started any new medications, lotions or cosmetics.  Has not traveled anywhere.  Seems to be handling her husband's passing very well.  Continues to work 2 jobs.  Denies being under excessive stress but continues to grieve.  Has not been sleeping all that well.  Has not had any respiratory illness or sore throat.  No cough.    Review of Systems see above main complaint is itching all over and issues sleeping     Objective:   Physical Exam Skin is warm and dry.  Nodes none.  Neck is supple.  Chest clear.  No obvious rash.       Assessment & Plan:  Pruritus which may be due to stress  Plan: She has been taking Claritin and she can continue with that.  Recommend Zantac 150 milligrams twice daily which is a histamine blocker.  Tapering course of prednisone going from 60 mg to 0 mg over 6 days.  Depo-Medrol 80 mg IM.  Take Xanax 3.56 mg certainly at bedtime and up to twice daily as needed.

## 2017-12-16 DIAGNOSIS — R351 Nocturia: Secondary | ICD-10-CM | POA: Diagnosis not present

## 2017-12-16 DIAGNOSIS — R8271 Bacteriuria: Secondary | ICD-10-CM | POA: Diagnosis not present

## 2017-12-16 DIAGNOSIS — N3941 Urge incontinence: Secondary | ICD-10-CM | POA: Diagnosis not present

## 2017-12-16 DIAGNOSIS — R350 Frequency of micturition: Secondary | ICD-10-CM | POA: Diagnosis not present

## 2017-12-23 DIAGNOSIS — Z01419 Encounter for gynecological examination (general) (routine) without abnormal findings: Secondary | ICD-10-CM | POA: Diagnosis not present

## 2017-12-23 DIAGNOSIS — Z1231 Encounter for screening mammogram for malignant neoplasm of breast: Secondary | ICD-10-CM | POA: Diagnosis not present

## 2018-01-21 DIAGNOSIS — R350 Frequency of micturition: Secondary | ICD-10-CM | POA: Diagnosis not present

## 2018-01-21 DIAGNOSIS — N3941 Urge incontinence: Secondary | ICD-10-CM | POA: Diagnosis not present

## 2018-01-21 DIAGNOSIS — R8271 Bacteriuria: Secondary | ICD-10-CM | POA: Diagnosis not present

## 2018-03-16 ENCOUNTER — Encounter: Payer: Self-pay | Admitting: Internal Medicine

## 2018-03-16 ENCOUNTER — Ambulatory Visit (INDEPENDENT_AMBULATORY_CARE_PROVIDER_SITE_OTHER): Payer: Managed Care, Other (non HMO) | Admitting: Internal Medicine

## 2018-03-16 VITALS — BP 120/88 | HR 70 | Temp 98.2°F | Ht 63.0 in | Wt 199.0 lb

## 2018-03-16 DIAGNOSIS — J22 Unspecified acute lower respiratory infection: Secondary | ICD-10-CM

## 2018-03-16 MED ORDER — PREDNISONE 10 MG PO TABS: ORAL_TABLET | ORAL | 0 refills | Status: DC

## 2018-03-16 MED ORDER — LEVOFLOXACIN 500 MG PO TABS: 500.0000 mg | ORAL_TABLET | Freq: Every day | ORAL | 0 refills | Status: DC

## 2018-03-16 MED ORDER — BENZONATATE 100 MG PO CAPS: 200.0000 mg | ORAL_CAPSULE | Freq: Three times a day (TID) | ORAL | 0 refills | Status: DC

## 2018-03-16 NOTE — Progress Notes (Signed)
   Subjective:    Patient ID: Brandi Berry, female    DOB: [birth year], 63 y.o.   MRN: [id number]  HPI Onset a couple of days ago of fever chills myalgias.  Did not take flu vaccine.  Has had cough congestion and wheezing.  Some headache.    Review of Systems no nausea or vomiting.  Cough is productive at times.  Has noticed wheezing.    Objective:   Physical Exam Skin warm and dry.  Nodes none.  TMs are clear.  Pharynx is clear.  Neck is supple.  Chest clear to auscultation without rales or wheezing.  Rapid flu test is negative     Assessment & Plan:  Probable influenza  Lower respiratory infection  Plan: Levaquin 500 mg daily for 10 days.  Sterapred DS 10 mg six-day Dosepak.  Tessalon Perles 200 mg 3 times a day as needed for cough.  Rest and drink plenty of fluids.

## 2018-03-16 NOTE — Patient Instructions (Signed)
Tessalon Perles 200 mg 3 times a day as needed for cough.  Rest and drink plenty of fluids.  Take Sterapred DS 10 mg six-day Dosepak as directed.  Levaquin 500 mg daily for 10 days.

## 2018-04-01 ENCOUNTER — Other Ambulatory Visit: Payer: 59 | Admitting: Internal Medicine

## 2018-05-25 ENCOUNTER — Ambulatory Visit (INDEPENDENT_AMBULATORY_CARE_PROVIDER_SITE_OTHER): Payer: Managed Care, Other (non HMO) | Admitting: Internal Medicine

## 2018-05-25 ENCOUNTER — Other Ambulatory Visit: Payer: Self-pay

## 2018-05-25 ENCOUNTER — Telehealth: Payer: Self-pay | Admitting: Internal Medicine

## 2018-05-25 DIAGNOSIS — N390 Urinary tract infection, site not specified: Secondary | ICD-10-CM

## 2018-05-25 MED ORDER — LEVOFLOXACIN 500 MG PO TABS: 500.0000 mg | ORAL_TABLET | Freq: Every day | ORAL | 0 refills | Status: DC

## 2018-05-25 NOTE — Telephone Encounter (Signed)
[facility]  Brandi Berry called to say she started feeling bad over the weekend and it is not getting any better, she has chest tightness, some sinus, headache rated on scale 1-10 she gives it an 8,no cough,lower back pain, achy all over, possible low grade fever, yesterday she could not get warm, she went to work yesterday and today, but is now leaving work because she just does not feel good. No travel, no contact with anyone sick.

## 2018-05-25 NOTE — Telephone Encounter (Signed)
Set up televisit at 4:30 pm

## 2018-05-25 NOTE — Progress Notes (Signed)
   Subjective:    Patient ID: Brandi Berry, female    DOB: 10-02-1955, 63 y.o.   MRN: [id number]  HPI  63 year old Female with malaise and urinary frequency.  Slight nausea but no vomiting.  Symptoms began on Saturday, March 21.  Patient complained of back hurting, headache, chills, no temperature.  No dysuria.  No diarrhea.  Urinary frequency every hour or so.  No blood in urine.  Interactive audio and video telecommunications were  Attempted between provider and patient however failed due to technical difficulties.  We continued and completed visit with audio only. Review of Systems     Objective:   Physical Exam  Patient denies fever      Assessment & Plan:  Probable urinary infection  Possible viral syndrome  Plan:: Levaquin 500 mg daily for 7 days E scribed to pharmacy.  Tylenol as needed for pain.  Rest and drink plenty of fluids.  Call if not better in 24 to 48 hours or sooner if worse

## 2018-05-27 ENCOUNTER — Encounter (HOSPITAL_COMMUNITY): Payer: Self-pay | Admitting: Emergency Medicine

## 2018-05-27 ENCOUNTER — Other Ambulatory Visit: Payer: Self-pay

## 2018-05-27 ENCOUNTER — Emergency Department (HOSPITAL_COMMUNITY)
Admission: EM | Admit: 2018-05-27 | Discharge: 2018-05-28 | Disposition: A | Discharge status: Home / Self Care | Payer: Managed Care, Other (non HMO) | Attending: Emergency Medicine | Admitting: Emergency Medicine

## 2018-05-27 ENCOUNTER — Emergency Department (HOSPITAL_COMMUNITY): Payer: Managed Care, Other (non HMO)

## 2018-05-27 DIAGNOSIS — Z87891 Personal history of nicotine dependence: Secondary | ICD-10-CM | POA: Insufficient documentation

## 2018-05-27 DIAGNOSIS — R0602 Shortness of breath: Secondary | ICD-10-CM | POA: Insufficient documentation

## 2018-05-27 DIAGNOSIS — R509 Fever, unspecified: Secondary | ICD-10-CM | POA: Diagnosis not present

## 2018-05-27 DIAGNOSIS — M791 Myalgia, unspecified site: Secondary | ICD-10-CM | POA: Diagnosis not present

## 2018-05-27 DIAGNOSIS — R52 Pain, unspecified: Secondary | ICD-10-CM

## 2018-05-27 DIAGNOSIS — Z79899 Other long term (current) drug therapy: Secondary | ICD-10-CM | POA: Diagnosis not present

## 2018-05-27 DIAGNOSIS — R350 Frequency of micturition: Secondary | ICD-10-CM | POA: Insufficient documentation

## 2018-05-27 LAB — BASIC METABOLIC PANEL
Anion gap: 12 (ref 5–15)
BUN: 14 mg/dL (ref 8–23)
CO2: 22 mmol/L (ref 22–32)
Calcium: 9.5 mg/dL (ref 8.9–10.3)
Chloride: 105 mmol/L (ref 98–111)
Creatinine, Ser: 0.95 mg/dL (ref 0.44–1.00)
GFR calc Af Amer: >60 mL/min (ref >60)
GFR calc non Af Amer: >60 mL/min (ref >60)
Glucose, Bld: 120 mg/dL — ABNORMAL HIGH (ref 70–99)
Potassium: 3.4 mmol/L — ABNORMAL LOW (ref 3.5–5.1)
Sodium: 139 mmol/L (ref 135–145)

## 2018-05-27 LAB — CBC WITH DIFFERENTIAL/PLATELET
Abs Immature Granulocytes: 0.04 10*3/uL (ref 0.00–0.07)
BASOS ABS: 0 10*3/uL (ref 0.0–0.1)
Basophils Relative: 0 %
EOS PCT: 0 %
Eosinophils Absolute: 0 10*3/uL (ref 0.0–0.5)
HCT: 45.4 % (ref 36.0–46.0)
Hemoglobin: 14.9 g/dL (ref 12.0–15.0)
Immature Granulocytes: 1 %
Lymphocytes Relative: 12 %
Lymphs Abs: 0.8 10*3/uL (ref 0.7–4.0)
MCH: 30.7 pg (ref 26.0–34.0)
MCHC: 32.8 g/dL (ref 30.0–36.0)
MCV: 93.4 fL (ref 80.0–100.0)
Monocytes Absolute: 0.5 10*3/uL (ref 0.1–1.0)
Monocytes Relative: 7 %
Neutro Abs: 5.4 10*3/uL (ref 1.7–7.7)
Neutrophils Relative %: 80 %
Platelets: 200 10*3/uL (ref 150–400)
RBC: 4.86 MIL/uL (ref 3.87–5.11)
RDW: 13.3 % (ref 11.5–15.5)
WBC: 6.7 10*3/uL (ref 4.0–10.5)
nRBC: 0 % (ref 0.0–0.2)

## 2018-05-27 LAB — TROPONIN I: Troponin I: <0.03 ng/mL (ref <0.03)

## 2018-05-27 MED ORDER — IBUPROFEN 800 MG PO TABS
800.0000 mg | ORAL_TABLET | Freq: Once | ORAL | Status: AC
  Administered 2018-05-27: 800 mg via ORAL
  Filled 2018-05-27: qty 1

## 2018-05-27 MED ORDER — SODIUM CHLORIDE 0.9 % IV BOLUS
1000.0000 mL | Freq: Once | INTRAVENOUS | Status: AC
  Administered 2018-05-27: 1000 mL via INTRAVENOUS

## 2018-05-27 NOTE — ED Notes (Signed)
XR at bedside

## 2018-05-27 NOTE — ED Notes (Signed)
Pt unable to give urine sample right now

## 2018-05-27 NOTE — ED Triage Notes (Signed)
Patient has had fever and body aches since Monday. Patient has not had a cough, nausea, or vomiting. Patient states she has not taken anything for the fever.

## 2018-05-27 NOTE — ED Provider Notes (Signed)
[facility] DEPT Provider Note   CSN: [id number] Arrival date & time: 05/27/18  2102    History   Chief Complaint Chief Complaint  Patient presents with  . Fever    HPI Brandi Berry is a 63 y.o. female.     The history is provided by the patient and medical records.  Fever  Associated symptoms: myalgias      63 year old female with history of seasonal allergies, migraine headaches, chronic hematuria, presenting to the ED with fever.  States she initially started feeling bad about 2 days ago, predominantly fever and body aches.  She has noticed a little bit of shortness of breath but denies any cough, nasal congestion, sore throat, ear pain, or other URI symptoms.  She has not had any chest pain.  She does report some urinary frequency but denies any dysuria or hematuria.  She does not had any pelvic pain or vaginal discharge.  Temp on arrival here 103.28F.  No meds PTA.  Denies sick contacts, recent travel, or known COVID exposures.  Past Medical History:  Diagnosis Date  . Allergy   . Microscopic hematuria   . Migraine   . Urinary incontinence     Patient Active Problem List   Diagnosis Date Noted  . Somnolence, daytime 04/28/2017  . Morning headache 12/31/2016  . Medication overuse headache 11/23/2016  . Allergic rhinitis 06/23/2013  . Pruritus 06/23/2013  . Migraine headache 09/29/2010    Past Surgical History:  Procedure Laterality Date  . CESAREAN SECTION    . EYE SURGERY       OB History   No obstetric history on file.      Home Medications    Prior to Admission medications   Medication Sig Start Date End Date Taking? Authorizing Provider  ALPRAZolam (XANAX) 0.25 MG tablet Take 1 tablet (0.25 mg total) by mouth 2 (two) times daily as needed for anxiety. 11/11/17   Elby Showers, MD  benzonatate (TESSALON PERLES) 100 MG capsule Take 2 capsules (200 mg total) by mouth 3 (three) times daily. 03/16/18   Elby Showers, MD  levofloxacin (LEVAQUIN) 500 MG tablet Take 1 tablet (500 mg total) by mouth daily. 03/16/18   Elby Showers, MD  levofloxacin (LEVAQUIN) 500 MG tablet Take 1 tablet (500 mg total) by mouth daily. 05/25/18   Elby Showers, MD  Multiple Vitamin (MULTIVITAMIN) capsule Take 1 capsule by mouth daily.      [provider]  ondansetron (ZOFRAN) 4 MG tablet Take 1 tablet (4 mg total) by mouth every 8 (eight) hours as needed for nausea or vomiting. 10/08/16   Elby Showers, MD  pantoprazole (PROTONIX) 40 MG tablet Take 1 tablet (40 mg total) by mouth daily. 10/08/16   Elby Showers, MD  predniSONE (DELTASONE) 10 MG tablet Take in tapering course as directed 6-5-4-3-2-1 03/16/18   Elby Showers, MD  terconazole (TERAZOL 7) 0.4 % vaginal cream Place 1 applicator vaginally at bedtime. 10/19/17   Elby Showers, MD    Family History Family History  Problem Relation Age of Onset  . Healthy Sister   . Stroke Father        also alocholic  . Colon cancer Neg Hx   . Esophageal cancer Neg Hx   . Pancreatic cancer Neg Hx   . Rectal cancer Neg Hx   . Stomach cancer Neg Hx     Social History Social History   Tobacco Use  .  Smoking status: Former Smoker    Last attempt to quit: 03/02/1993    Years since quitting: 25.2  . Smokeless tobacco: Never Used  Substance Use Topics  . Alcohol use: Yes    Alcohol/week: 4.0 standard drinks    Types: 4 Standard drinks or equivalent per week  . Drug use: No     Allergies   Penicillins; Tizanidine; and Vicodin [hydrocodone-acetaminophen]   Review of Systems Review of Systems  Constitutional: Positive for fever.  Musculoskeletal: Positive for myalgias.  All other systems reviewed and are negative.    Physical Exam Updated Vital Signs BP 124/67 (BP Location: Right Arm)   Pulse 88   Temp (!) 103.1 F (39.5 C) (Oral)   Resp 18   Ht 5\' 4"  (1.626 m)   Wt 86.2 kg   SpO2 94%   BMI 32.61 kg/m   Physical Exam Vitals signs and nursing note  reviewed.  Constitutional:      Appearance: She is well-developed.     Comments: Warm to touch, NAD  HENT:     Head: Normocephalic and atraumatic.  Eyes:     Conjunctiva/sclera: Conjunctivae normal.     Pupils: Pupils are equal, round, and reactive to light.  Neck:     Musculoskeletal: Normal range of motion.  Cardiovascular:     Rate and Rhythm: Normal rate and regular rhythm.     Heart sounds: Normal heart sounds.  Pulmonary:     Effort: Pulmonary effort is normal.     Breath sounds: Normal breath sounds.  Abdominal:     General: Bowel sounds are normal.     Palpations: Abdomen is soft.     Tenderness: There is no abdominal tenderness. There is no right CVA tenderness or left CVA tenderness.     Comments: Soft, non-tender, no CVA tenderness  Musculoskeletal: Normal range of motion.  Skin:    General: Skin is warm and dry.  Neurological:     Mental Status: She is alert and oriented to person, place, and time.      ED Treatments / Results  Labs (all labs ordered are listed, but only abnormal results are displayed) Labs Reviewed  BASIC METABOLIC PANEL - Abnormal; Notable for the following components:      Result Value   Potassium 3.4 (*)    Glucose, Bld 120 (*)    All other components within normal limits  URINE CULTURE  CULTURE, BLOOD (ROUTINE X 2)  CULTURE, BLOOD (ROUTINE X 2)  CBC WITH DIFFERENTIAL/PLATELET  LACTIC ACID, PLASMA  INFLUENZA PANEL BY PCR (TYPE A & B)  TROPONIN I  URINALYSIS, ROUTINE W REFLEX MICROSCOPIC    EKG EKG Interpretation  Date/Time:  Friday May 27 2018 22:54:12 EDT Ventricular Rate:  88 PR Interval:    QRS Duration: 100 QT Interval:  341 QTC Calculation: 413 R Axis:   63 Text Interpretation:  Sinus rhythm Consider right atrial enlargement Low voltage, precordial leads No STEMI.  Confirmed by Nanda Quinton [phone]) on 05/27/2018 11:14:17 PM   Radiology Dg Chest Portable 1 View  Result Date: 05/27/2018 CLINICAL DATA:  Fever, body  aches EXAM: PORTABLE CHEST 1 VIEW COMPARISON:  None. FINDINGS: The heart size and mediastinal contours are within normal limits. Both lungs are clear. The visualized skeletal structures are unremarkable. IMPRESSION: No acute abnormality of the lungs in frontal projection. Electronically Signed   By: Eddie Candle M.D.   On: 05/27/2018 23:26    Procedures Procedures (including critical care time)  Medications Ordered  in ED Medications  sodium chloride 0.9 % bolus 1,000 mL (0 mLs Intravenous Stopped 05/28/18 0136)  ibuprofen (ADVIL,MOTRIN) tablet 800 mg (800 mg Oral Given 05/27/18 2313)     Initial Impression / Assessment and Plan / ED Course  I have reviewed the triage vital signs and the nursing notes.  Pertinent labs & imaging results that were available during my care of the patient were reviewed by me and considered in my medical decision making (see chart for details).  Antony Contras [address]-Street was evaluated in Emergency Department on 05/28/2018 for the symptoms described in the history of present illness. She was evaluated in the context of the global COVID-19 pandemic, which necessitated consideration that the patient might be at risk for infection with the SARS-CoV-2 virus that causes COVID-19. Institutional protocols and algorithms that pertain to the evaluation of patients at risk for COVID-19 are in a state of rapid change based on information released by regulatory bodies including the CDC and federal and state organizations. These policies and algorithms were followed during the patient's care in the ED.  Work-up is overall reassuring-- labs with normal CBC, lactate and chemistry.  Troponin negative.  CXR clear.  Flu test is negative.  Patient vital have remained stable, she has defervesced after motrin here.  No hypoxia, tachycardia, or clinical signs of sepsis at this time necessitating admission.  Per hospital protocols currently in place, testing limited to those sick enough to require  hospitalization and/or intubation.  Patient appears stable for discharge, however possibility of COVID still exists.  She is felt to be low risk without known exposure or travel history.  She will be given instructions for home quarantine, limiting contact with others for at least 3 days until fever free without medications and at least 7 days after symptoms began.  She can return here for any new/acute changes.  Final Clinical Impressions(s) / ED Diagnoses   Final diagnoses:  Body aches  Fever, unspecified    ED Discharge Orders      Larene Pickett, PA-C 05/28/18 0323    [name] Leigh, MD 05/28/18 [phone]

## 2018-05-28 ENCOUNTER — Encounter: Payer: Self-pay | Admitting: Internal Medicine

## 2018-05-28 LAB — INFLUENZA PANEL BY PCR (TYPE A & B)
Influenza A By PCR: NEGATIVE
Influenza B By PCR: NEGATIVE

## 2018-05-28 LAB — LACTIC ACID, PLASMA: LACTIC ACID, VENOUS: 0.9 mmol/L (ref 0.5–1.9)

## 2018-05-28 MED ORDER — IBUPROFEN 800 MG PO TABS: 800.0000 mg | ORAL_TABLET | Freq: Three times a day (TID) | ORAL | 0 refills | Status: DC

## 2018-05-28 NOTE — Patient Instructions (Addendum)
Levaquin 500 mg daily for 7 days.  Rest and drink plenty of fluids.  Tylenol as needed for pain call if not better in 24 to 48 hours or sooner if worse.

## 2018-05-28 NOTE — Discharge Instructions (Signed)
Your labs today were all reassuring.  Your flu test was negative, chest x-ray was normal.   Take tylenol or motrin for fever/body aches. You have not been tested for COVID as tests are limited and reserved only for those requiring hospitalization.  You need to abide by the same precautions as if you did have COVID which are outlined below. QUARANTINE FOR AT LEAST 3 DAYS FEVER FREE WITHOUT MEDICATION AND IMPROVEMENT OF COUGH/RESPIRATORY SYMPTOMS AND AT LEAST 7 DAYS AFTER SYMPTOMS BEGAN. Return to the ED for new or worsening symptoms-- shortness of breath, fever not responding to medications, etc.     Person Under Monitoring Name: Brandi Berry  Location: [address] [place] [address]   Infection Prevention Recommendations for Individuals Confirmed to have, or Being Evaluated for, 2019 Novel Coronavirus (COVID-19) Infection Who Receive Care at Home  Individuals who are confirmed to have, or are being evaluated for, COVID-19 should follow the prevention steps below until a healthcare provider or local or state health department says they can return to normal activities.  Stay home except to get medical care You should restrict activities outside your home, except for getting medical care. Do not go to work, school, or public areas, and do not use public transportation or taxis.  Call ahead before visiting your doctor Before your medical appointment, call the healthcare provider and tell them that you have, or are being evaluated for, COVID-19 infection. This will help the healthcare providers office take steps to keep other people from getting infected. Ask your healthcare provider to call the local or state health department.  Monitor your symptoms Seek prompt medical attention if your illness is worsening (e.g., difficulty breathing). Before going to your medical appointment, call the healthcare provider and tell them that you have, or are being evaluated for,  COVID-19 infection. Ask your healthcare provider to call the local or state health department.  Wear a facemask You should wear a facemask that covers your nose and mouth when you are in the same room with other people and when you visit a healthcare provider. People who live with or visit you should also wear a facemask while they are in the same room with you.  Separate yourself from other people in your home As much as possible, you should stay in a different room from other people in your home. Also, you should use a separate bathroom, if available.  Avoid sharing household items You should not share dishes, drinking glasses, cups, eating utensils, towels, bedding, or other items with other people in your home. After using these items, you should wash them thoroughly with soap and water.  Cover your coughs and sneezes Cover your mouth and nose with a tissue when you cough or sneeze, or you can cough or sneeze into your sleeve. Throw used tissues in a lined trash can, and immediately wash your hands with soap and water for at least 20 seconds or use an alcohol-based hand rub.  Wash your [facility] your hands often and thoroughly with soap and water for at least 20 seconds. You can use an alcohol-based hand sanitizer if soap and water are not available and if your hands are not visibly dirty. Avoid touching your eyes, nose, and mouth with unwashed hands.   Prevention Steps for Caregivers and Household Members of Individuals Confirmed to have, or Being Evaluated for, COVID-19 Infection Being Cared for in the Home  If you live with, or provide care at home for, a person  confirmed to have, or being evaluated for, COVID-19 infection please follow these guidelines to prevent infection:  Follow healthcare providers instructions Make sure that you understand and can help the patient follow any healthcare provider instructions for all care.  Provide for the patients basic needs You  should help the patient with basic needs in the home and provide support for getting groceries, prescriptions, and other personal needs.  Monitor the patients symptoms If they are getting sicker, call his or her medical provider and tell them that the patient has, or is being evaluated for, COVID-19 infection. This will help the healthcare providers office take steps to keep other people from getting infected. Ask the healthcare provider to call the local or state health department.  Limit the number of people who have contact with the patient If possible, have only one caregiver for the patient. Other household members should stay in another home or place of residence. If this is not possible, they should stay in another room, or be separated from the patient as much as possible. Use a separate bathroom, if available. Restrict visitors who do not have an essential need to be in the home.  Keep older adults, very young children, and other sick people away from the patient Keep older adults, very young children, and those who have compromised immune systems or chronic health conditions away from the patient. This includes people with chronic heart, lung, or kidney conditions, diabetes, and cancer.  Ensure good ventilation Make sure that shared spaces in the home have good air flow, such as from an air conditioner or an opened window, weather permitting.  Wash your hands often Wash your hands often and thoroughly with soap and water for at least 20 seconds. You can use an alcohol based hand sanitizer if soap and water are not available and if your hands are not visibly dirty. Avoid touching your eyes, nose, and mouth with unwashed hands. Use disposable paper towels to dry your hands. If not available, use dedicated cloth towels and replace them when they become wet.  Wear a facemask and gloves Wear a disposable facemask at all times in the room and gloves when you touch or have contact  with the patients blood, body fluids, and/or secretions or excretions, such as sweat, saliva, sputum, nasal mucus, vomit, urine, or feces.  Ensure the mask fits over your nose and mouth tightly, and do not touch it during use. Throw out disposable facemasks and gloves after using them. Do not reuse. Wash your hands immediately after removing your facemask and gloves. If your personal clothing becomes contaminated, carefully remove clothing and launder. Wash your hands after handling contaminated clothing. Place all used disposable facemasks, gloves, and other waste in a lined container before disposing them with other household waste. Remove gloves and wash your hands immediately after handling these items.  Do not share dishes, glasses, or other household items with the patient Avoid sharing household items. You should not share dishes, drinking glasses, cups, eating utensils, towels, bedding, or other items with a patient who is confirmed to have, or being evaluated for, COVID-19 infection. After the person uses these items, you should wash them thoroughly with soap and water.  Wash laundry thoroughly Immediately remove and wash clothes or bedding that have blood, body fluids, and/or secretions or excretions, such as sweat, saliva, sputum, nasal mucus, vomit, urine, or feces, on them. Wear gloves when handling laundry from the patient. Read and follow directions on labels of laundry or clothing  items and detergent. In general, wash and dry with the warmest temperatures recommended on the label.  Clean all areas the individual has used often Clean all touchable surfaces, such as counters, tabletops, doorknobs, bathroom fixtures, toilets, phones, keyboards, tablets, and bedside tables, every day. Also, clean any surfaces that may have blood, body fluids, and/or secretions or excretions on them. Wear gloves when cleaning surfaces the patient has come in contact with. Use a diluted bleach solution  (e.g., dilute bleach with 1 part bleach and 10 parts water) or a household disinfectant with a label that says EPA-registered for coronaviruses. To make a bleach solution at home, add 1 tablespoon of bleach to 1 quart (4 cups) of water. For a larger supply, add  cup of bleach to 1 gallon (16 cups) of water. Read labels of cleaning products and follow recommendations provided on product labels. Labels contain instructions for safe and effective use of the cleaning product including precautions you should take when applying the product, such as wearing gloves or eye protection and making sure you have good ventilation during use of the product. Remove gloves and wash hands immediately after cleaning.  Monitor yourself for signs and symptoms of illness Caregivers and household members are considered close contacts, should monitor their health, and will be asked to limit movement outside of the home to the extent possible. Follow the monitoring steps for close contacts listed on the symptom monitoring form.   ? If you have additional questions, contact your local health department or call the epidemiologist on call at [phone] (available 24/7). ? This guidance is subject to change. For the most up-to-date guidance from [facility], please refer to their website: [email or web address]

## 2018-05-28 NOTE — ED Notes (Addendum)
Pt verbalized discharge instructions and follow up care. Alert and ambulatory. IV removed. Understand importance of self-quarantine and washing hands. Given covid monitoring tools

## 2018-06-02 LAB — CULTURE, BLOOD (ROUTINE X 2)
Culture: NO GROWTH
Culture: NO GROWTH
Special Requests: ADEQUATE
Special Requests: ADEQUATE

## 2018-06-13 IMAGING — CR DG ABDOMEN 2V
2 series · 2 of 2 positions shown · non-contrast
Comparison: None.

CLINICAL DATA: Periumbilical abdominal pain for 1 week.

EXAM:
ABDOMEN - 2 VIEW

[w abdomen upright]
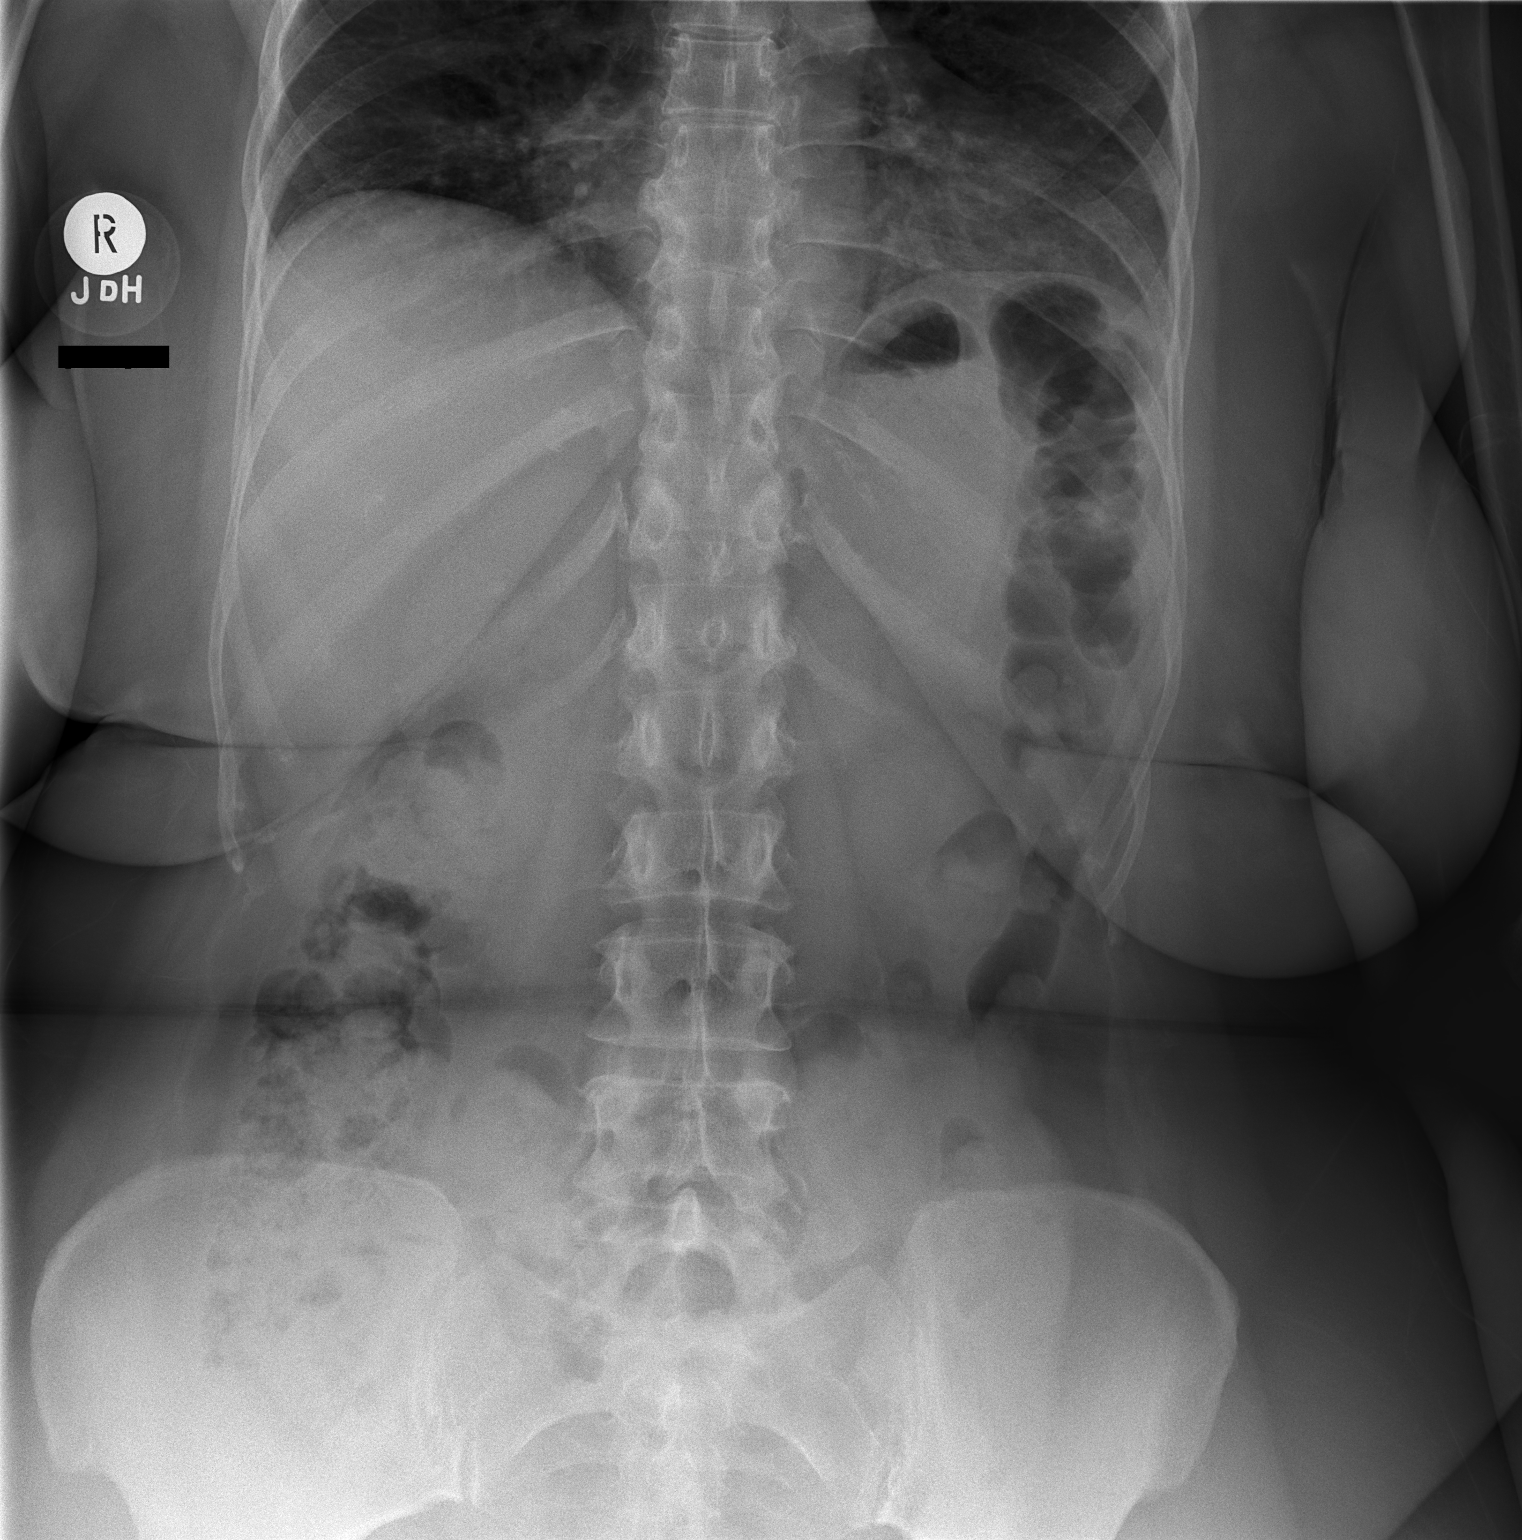

[t abdomen supine]
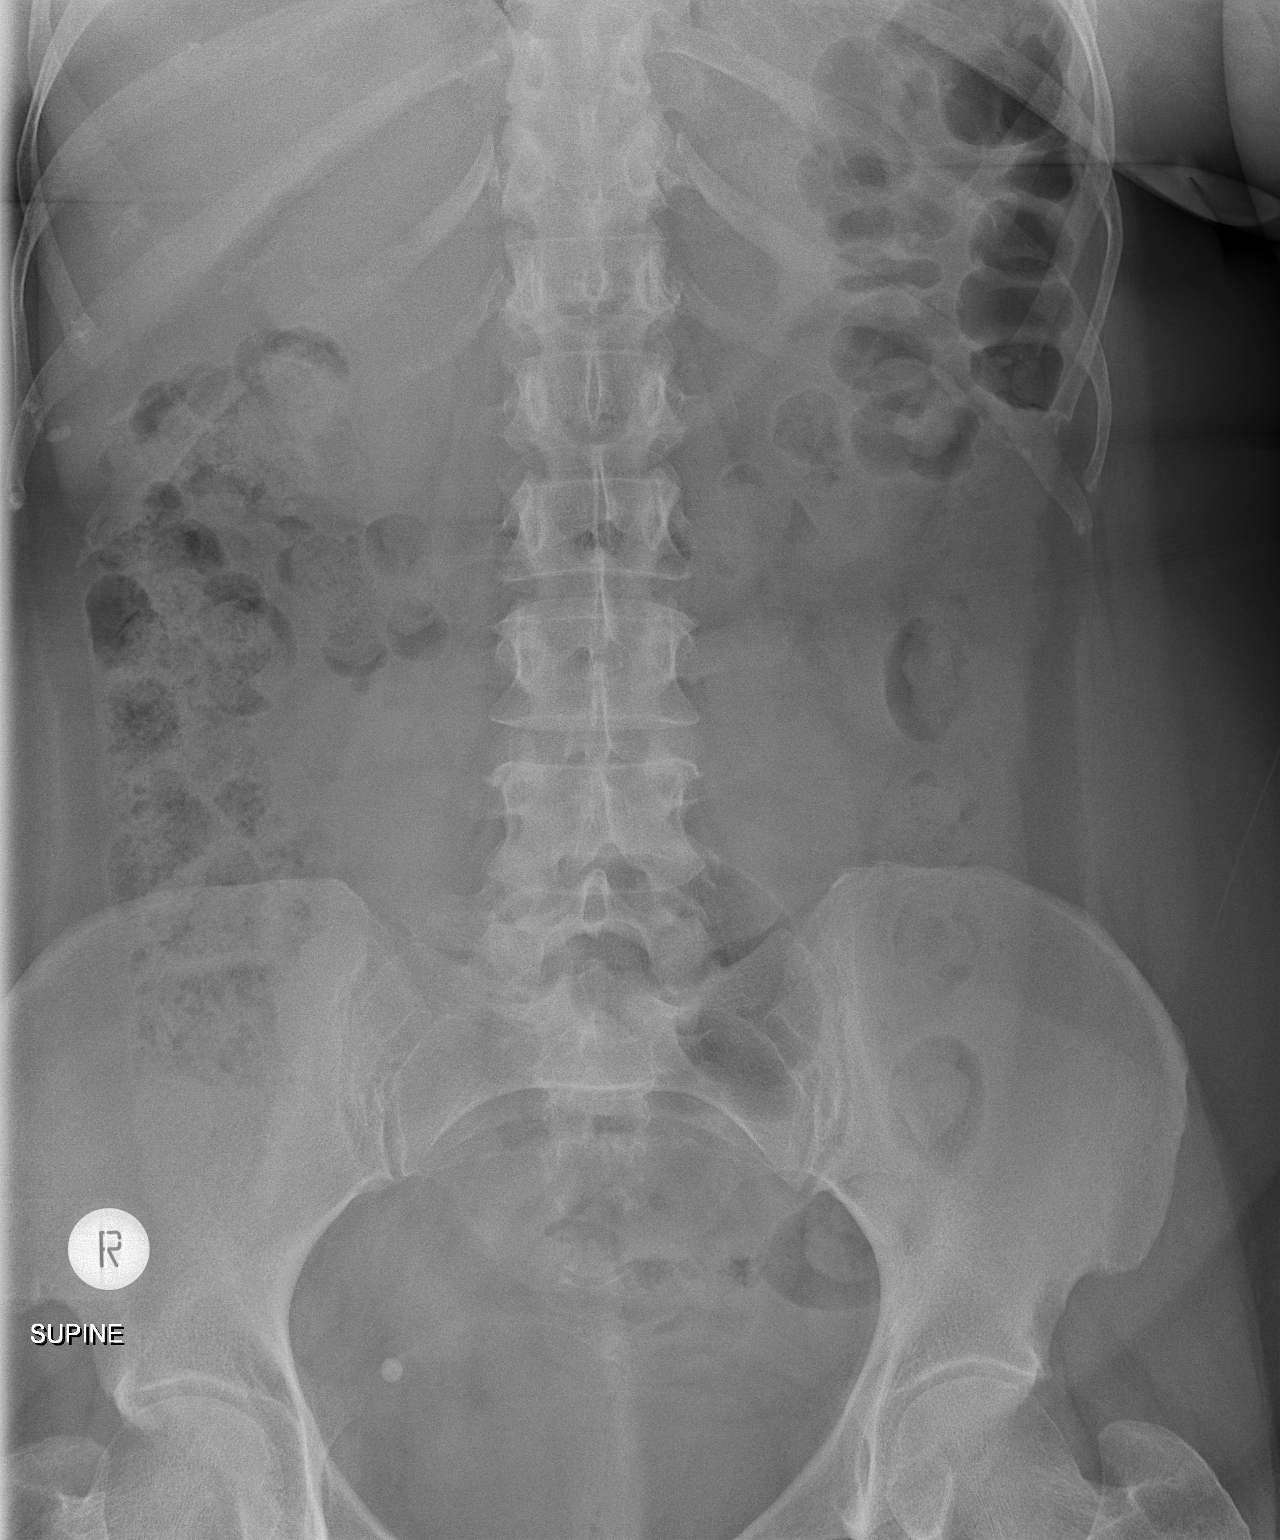

[2 of 2 positions shown; findings below may reference images not displayed]

FINDINGS: The bowel gas pattern is normal. There is no evidence of free air.
No radio-opaque calculi or other significant radiographic
abnormality is seen.
IMPRESSION: No evidence of bowel obstruction or ileus.

## 2018-06-28 ENCOUNTER — Telehealth: Payer: Self-pay | Admitting: Neurology

## 2018-06-28 NOTE — Telephone Encounter (Signed)
Noted thanks so much.  

## 2018-06-28 NOTE — Telephone Encounter (Signed)
Patient has been moved to 05/05 by Bary Castilla and I converted to phone visit.

## 2018-06-28 NOTE — Telephone Encounter (Signed)
I called the patient and LVM asking for a call back. Advised Dr. Jaynee Eagles will not be doing telemedicine on May 12th so we will need to move her telephone appt to another day. Advised there are openings right now for Tuesday 5/5, Thursday 5/7 for example. Left office number in message.

## 2018-06-28 NOTE — Telephone Encounter (Signed)
Pt returned clal concerning her appt on 07/12/2018 at 9:30am , discussed VV info with pt she stated she doesnt have access to web cam , educated pt on Televisit she stated that would work better for her. Pt gives consent for Televisit on 07/12/2018 @ 9:30am. She has been educated on insurance information.

## 2018-06-30 NOTE — Telephone Encounter (Signed)
Pt called back earlier today but I was unable to take her call. I tried to call her this evening but had to LVM indicating to patient that I would call back Monday to update her chart.

## 2018-06-30 NOTE — Telephone Encounter (Signed)
Called pt & LVM asking for call back to update chart. Left office number in message.

## 2018-07-04 ENCOUNTER — Encounter: Payer: Self-pay | Admitting: Neurology

## 2018-07-04 NOTE — Addendum Note (Signed)
Addended by: Gildardo Griffes on: 07/04/2018 01:35 PM   Modules accepted: Orders

## 2018-07-04 NOTE — Telephone Encounter (Signed)
Pt returned my call. Confirmed pt using 2 identifiers. Updated chart including medications. No change in PMH, pharmacy, or allergies. Pt understands she will receive a call at 8:00 AM to check in followed by call from Dr. Jaynee Eagles at 8:30 AM. She verbalized appreciation.

## 2018-07-04 NOTE — Telephone Encounter (Signed)
Called pt on both mobile & home #s and LVM asking for call back to update information before telephone visit. Left office number in message for call back.

## 2018-07-05 ENCOUNTER — Other Ambulatory Visit: Payer: Self-pay

## 2018-07-05 ENCOUNTER — Ambulatory Visit (INDEPENDENT_AMBULATORY_CARE_PROVIDER_SITE_OTHER): Payer: Managed Care, Other (non HMO) | Admitting: Neurology

## 2018-07-05 DIAGNOSIS — G43709 Chronic migraine without aura, not intractable, without status migrainosus: Secondary | ICD-10-CM | POA: Diagnosis not present

## 2018-07-05 MED ORDER — GALCANEZUMAB-GNLM 120 MG/ML ~~LOC~~ SOAJ: 240.0000 mg | Freq: Once | SUBCUTANEOUS | 0 refills | Status: DC

## 2018-07-05 MED ORDER — GALCANEZUMAB-GNLM 120 MG/ML ~~LOC~~ SOAJ: 120.0000 mg | SUBCUTANEOUS | 11 refills | Status: DC

## 2018-07-05 NOTE — Progress Notes (Signed)
[facility]    Provider:  Dr Jaynee Eagles Referring Provider: Elby Showers, MD Primary Care Physician:  Elby Showers, MD  CC:  Migraines  Virtual Visit via Telephone Note  I connected with Brandi Berry on 07/05/18 at  8:30 AM EDT by telephone and verified that I am speaking with the correct person using two identifiers.  Location: Patient: home Provider: office   I discussed the limitations, risks, security and privacy concerns of performing an evaluation and management service by telephone and the availability of in person appointments. I also discussed with the patient that there may be a patient responsible charge related to this service. The patient expressed understanding and agreed to proceed.  Follow Up Instructions:    I discussed the assessment and treatment plan with the patient. The patient was provided an opportunity to ask questions and all were answered. The patient agreed with the plan and demonstrated an understanding of the instructions.   The patient was advised to call back or seek an in-person evaluation if the symptoms worsen or if the condition fails to improve as anticipated.  I provided 30 minutes of non-face-to-face time during this encounter.   Melvenia Beam, MD    Interval history 07/05/2018: Patient here for follow up of headaches. She was encouraged several times to get a sleep study but did not. Claritin helped a little. She is still having headaches and migraines. Behind the right eye, pounding and throbbing, light and sound sensitivity, nausea, can last 24 hours, she has 20 headache days a month and at least half are migraines and moderately severe or severe. She does not drink caffeine. She has failed multiple medications. She does not have constipation. No latex allergy.   Tried: fioricet, flexeril, relpax, zofran, tizanidine, amitriptyline, Topamax, Relpax, zofran, prednisone,  HPI:  Brandi Berry is a 64  y.o. female here as a referral from Dr. Renold Genta for migraines. Past medical history of vitamin D deficiency, hyperlipidemia, migraines, recent stressors in her life. She has had migraines for 4-5 years. She examined her diet, no excessive caffeine, no medication overuse. Caffeine helps. She has been taking excedrin migraine (Tylenol, asa, caffeine) every other day as well as alka-seltzer for her headaches. Steroids help. She has been taking excedrin. Wine can trigger headaches. The migraines are above the right eye, throbbing,  She has 20 headache days a month and most are migrainous, smells will triger, strong perfumes, activity worsens the headache, the light doesn't bother her too much nor sound, she has nausea. Headaches lasts all day or days until she takes something. Some blurry vision, she gets flashes of lights sometimes before the migraines. No snoring or excessive daytime fatigue or morning headaches. She also takes alkaseltzer for her morning headaches. No other focal neurologic deficits, associated symptoms, inciting events or modifiable factors.  Reviewed notes, labs and imaging from outside physicians, which showed:  Relpax, amitriptyline, Topamax, Relpax, zofran, prednisone,   Review primary care notes, patient has frequent headaches which he thought or migraines, she can't seem to get relief without taking prednisone. Lots of situational stress with her husband, he is in poor health and housebound, she works 2 jobs, her son has been in trouble with the law. She is a previous smoker. Exam and occluding neurologic exam were normal. Patient does have vitamin D deficiency, hyperlipidemia.  IMPRESSION: 1. No evidence of acute ischemia. 2.  Non specific subcortical white matter changes supratentorially probably representing areas of ischemic gliosis related  to small vessel disease due to hypertension and/or diabetes.   3.  Focal 9.9 mm soft tissue prominence of the prevertebral soft tissues  at the level of C2.  This may represent significant focal pathology.  Further evaluation with a dedicated CT of the nasal/oropharynx is suggested for further clarification  . 3.  Inflammatory mucosal thickening of the paranasal sinuses.  Review of Systems: Patient complains of symptoms per HPI as well as the following symptoms: headaches. Pertinent negatives and positives per HPI. All others negative.   Social History   Socioeconomic History   Marital status: Widowed    Spouse name: Not on file   Number of children: 1   Years of education: Not on file   Highest education level: Master's degree (e.g., MA, MS, MEng, MEd, MSW, MBA)  Occupational History   Not on file  Social Needs   Financial resource strain: Not on file   Food insecurity:    Worry: Not on file    Inability: Not on file   Transportation needs:    Medical: Not on file    Non-medical: Not on file  Tobacco Use   Smoking status: Former Smoker    Last attempt to quit: 03/02/1993    Years since quitting: 25.3   Smokeless tobacco: Never Used  Substance and Sexual Activity   Alcohol use: Yes    Alcohol/week: 4.0 standard drinks    Types: 4 Standard drinks or equivalent per week   Drug use: No   Sexual activity: Not on file  Lifestyle   Physical activity:    Days per week: Not on file    Minutes per session: Not on file   Stress: Not on file  Relationships   Social connections:    Talks on phone: Not on file    Gets together: Not on file    Attends religious service: Not on file    Active member of club or organization: Not on file    Attends meetings of clubs or organizations: Not on file    Relationship status: Not on file   Intimate partner violence:    Fear of current or ex partner: Not on file    Emotionally abused: Not on file    Physically abused: Not on file    Forced sexual activity: Not on file  Other Topics Concern   Not on file  Social History Narrative   Caffeine very little  .  Lives at home alone.   One child.  [employer].  Works [employer].     Family History  Problem Relation Age of Onset   Healthy Sister    Stroke Father        also Actor cancer Neg Hx    Esophageal cancer Neg Hx    Pancreatic cancer Neg Hx    Rectal cancer Neg Hx    Stomach cancer Neg Hx     Past Medical History:  Diagnosis Date   Allergy    Microscopic hematuria    Migraine    Urinary incontinence     Past Surgical History:  Procedure Laterality Date   CESAREAN SECTION     EYE SURGERY      Current Outpatient Medications  Medication Sig Dispense Refill   ALPRAZolam (XANAX) 0.25 MG tablet Take 1 tablet (0.25 mg total) by mouth 2 (two) times daily as needed for anxiety. (Patient not taking: Reported on 07/04/2018) 60 tablet 0   Multiple Vitamin (MULTIVITAMIN) capsule Take 1  capsule by mouth daily.       terconazole (TERAZOL 7) 0.4 % vaginal cream Place 1 applicator vaginally at bedtime. 45 g 1   No current facility-administered medications for this visit.     Allergies as of 07/05/2018 - Review Complete 07/04/2018  Allergen Reaction Noted   Penicillins Hives 09/29/2010   Tizanidine Other (See Comments) 04/28/2017   Vicodin [hydrocodone-acetaminophen]  09/29/2010    Vitals: There were no vitals taken for this visit. Last Weight:  Wt Readings from Last 1 Encounters:  07/04/18 195 lb (88.5 kg)   Last Height:   Ht Readings from Last 1 Encounters:  07/04/18 5\' 4"  (1.626 m)    Assessment/Plan:  Migraines and Chronic daily headaches. MRI of the brain unremarkable. Failed multiple medications.  Start Emgality Again, encouraged a sleep study but she declines Discussed medication overuse. F/u in 3 months. Send me an email in 6-8 weeks for progress  To prevent or relieve headaches, try the following: Cool Compress. Lie down and place a cool compress on your head.  Avoid headache triggers. If certain foods or odors seem to have  triggered your migraines in the past, avoid them. A headache diary might help you identify triggers.  Include physical activity in your daily routine. Try a daily walk or other moderate aerobic exercise.  Manage stress. Find healthy ways to cope with the stressors, such as delegating tasks on your to-do list.  Practice relaxation techniques. Try deep breathing, yoga, massage and visualization.  Eat regularly. Eating regularly scheduled meals and maintaining a healthy diet might help prevent headaches. Also, drink plenty of fluids.  Follow a regular sleep schedule. Sleep deprivation might contribute to headaches Consider biofeedback. With this mind-body technique, you learn to control certain bodily functions -- such as muscle tension, heart rate and blood pressure -- to prevent headaches or reduce headache pain.    Proceed to emergency room if you experience new or worsening symptoms or symptoms do not resolve, if you have new neurologic symptoms or if headache is severe, or for any concerning symptom.    Meds ordered this encounter  Medications   Galcanezumab-gnlm (EMGALITY) 120 MG/ML SOAJ    Sig: Inject 240 mg into the skin once for 1 dose. Subsequent doses will be one injection monthly.    Dispense:  2 mL    Refill:  0    Patient has copay card; she can have medication regardless of insurance approval or copay amount.   Galcanezumab-gnlm (EMGALITY) 120 MG/ML SOAJ    Sig: Inject 120 mg into the skin every 30 (thirty) days. Do not fill for one month, loading dose sent in separate prescription. Patient has copay card; she can have medication for regardless of insurance approval or copay amount.    Dispense:  1 pen    Refill:  11    Cc: Dr. Orland Dec, [facility] [address] [place], [place] [address]  Phone [phone] Fax [phone]

## 2018-07-07 ENCOUNTER — Telehealth: Payer: Self-pay | Admitting: *Deleted

## 2018-07-07 NOTE — Telephone Encounter (Signed)
Emgality PA completed on Cover My Meds. KEY: [id number]. Requested approval for 2 pens initially for loading dose followed by 1 pen every 30 days thereafter. Request approved.   [id number];Review Type:Prior Auth;Coverage Start Date:06/07/2018;Coverage End Date:07/07/2019;

## 2018-07-12 ENCOUNTER — Ambulatory Visit: Payer: 59 | Admitting: Neurology

## 2018-08-03 NOTE — Telephone Encounter (Signed)
Spoke with [employer]. She stated the Emgality is still saying a PA is required. She confirmed she will call the pharmacy help desk to look into this today. I gave her the case ID [id number].

## 2018-08-03 NOTE — Telephone Encounter (Signed)
Spoke with [employer] @ [facility] once more and confirmed she tried to run the initial 2 pen loading dose and the 1 pen maintenance and neither would go through. She will call the pharmacy help desk today to inquire.

## 2018-08-09 NOTE — Telephone Encounter (Signed)
Spoke with [place] at [facility]. Still unable to process Emgality through insurance. She stated she would call the insurance company today to discuss.

## 2018-08-17 ENCOUNTER — Other Ambulatory Visit: Payer: Self-pay | Admitting: *Deleted

## 2018-08-17 DIAGNOSIS — G43709 Chronic migraine without aura, not intractable, without status migrainosus: Secondary | ICD-10-CM

## 2018-08-17 MED ORDER — EMGALITY 120 MG/ML ~~LOC~~ SOAJ: 120.0000 mg | SUBCUTANEOUS | 11 refills | Status: DC

## 2018-08-17 MED ORDER — EMGALITY 120 MG/ML ~~LOC~~ SOAJ: 240.0000 mg | Freq: Once | SUBCUTANEOUS | 0 refills | Status: AC

## 2018-08-17 NOTE — Addendum Note (Signed)
Addended by: Gildardo Griffes on: 08/17/2018 11:57 AM   Modules accepted: Orders

## 2018-08-17 NOTE — Telephone Encounter (Signed)
Pt returned call and stated the pharmacy advised her that the order for Galcanezumab-gnlm [facility]) 120 MG/ML SOAJ needs to be sent to [facility], pharmacy info updated

## 2018-08-17 NOTE — Telephone Encounter (Signed)
Emgality 240 mg loading dose and 120 mg Q30days maintenance sent to [facility] mail order pharmacy.

## 2018-08-17 NOTE — Telephone Encounter (Signed)
Spoke with pt. She has not received the loading dose yet either. Asked for both Emgality prescriptions to be sent to [facility].

## 2018-10-24 ENCOUNTER — Other Ambulatory Visit: Payer: Self-pay

## 2018-10-24 ENCOUNTER — Ambulatory Visit: Payer: Managed Care, Other (non HMO) | Admitting: Internal Medicine

## 2018-10-24 ENCOUNTER — Telehealth: Payer: Self-pay

## 2018-10-24 ENCOUNTER — Encounter: Payer: Self-pay | Admitting: Internal Medicine

## 2018-10-24 VITALS — BP 110/70 | HR 71 | Temp 98.4°F | Ht 63.0 in | Wt 212.0 lb

## 2018-10-24 DIAGNOSIS — M79602 Pain in left arm: Secondary | ICD-10-CM | POA: Diagnosis not present

## 2018-10-24 DIAGNOSIS — Z0000 Encounter for general adult medical examination without abnormal findings: Secondary | ICD-10-CM

## 2018-10-24 MED ORDER — MELOXICAM 15 MG PO TABS: 15.0000 mg | ORAL_TABLET | Freq: Every day | ORAL | 0 refills | Status: DC

## 2018-10-24 MED ORDER — HYDROMORPHONE HCL 2 MG PO TABS: 2.0000 mg | ORAL_TABLET | Freq: Two times a day (BID) | ORAL | 0 refills | Status: AC | PRN

## 2018-10-24 NOTE — Telephone Encounter (Signed)
Scheduled appointment

## 2018-10-24 NOTE — Telephone Encounter (Signed)
See this afternoon

## 2018-10-24 NOTE — Patient Instructions (Addendum)
Apply heat or ice to arm. Have mammogram. Take meloxicam 15 mg daily.  Take Dilaudid very sparingly for severe pain.  Call if not better in 2 weeks or sooner if worse.

## 2018-10-24 NOTE — Progress Notes (Signed)
   Subjective:    Patient ID: Brandi Berry, female    DOB: May 08, 1955, 63 y.o.   MRN: [id number]  HPI 63 year old Female who is going to be retiring in the near future is complaining of a one-week history of left upper extremity pain.  No known injury.  She has been working from home but also works part-time at [employer].  Denies heavy lifting.  Says pain is rather severe and incapacitating.  Can lift her left arm up over her head however.   Review of Systems see above-has not had a recent mammogram so one will be arranged.  Last mammogram on file was October 2019.  This was done through GYN (Dr. Ouida Sills)     Objective:   Physical Exam Muscle strength in the left upper extremity is normal.  She has no palpable axillary adenopathy or mass.  Left breast exam is normal without masses.  Complaining of pain left upper shoulder extending into deltoid and in axillary area.       Assessment & Plan:  My feeling is that this is musculoskeletal pain.  I am starting her on meloxicam 15 mg daily and gave her Dilaudid to take 2 mg sparingly every 8 hours for pain if needed.  Recommend heat or ice.  If not improving she will need to consider physical therapy.  20 minutes spent with patient.  I would like for her to have mammogram at [facility]

## 2018-10-24 NOTE — Telephone Encounter (Signed)
Patient called states for a week now her left arm has been trobing and hurting, pain radiates to her wrist and hand and the whole arm feels weak. She doesn't have SOB or CP. She wants to know if she can be seen for this. No COVID symptoms/exposure.

## 2018-10-27 ENCOUNTER — Other Ambulatory Visit: Payer: Self-pay

## 2018-10-27 ENCOUNTER — Ambulatory Visit: Admission: RE | Admit: 2018-10-27 | Payer: Managed Care, Other (non HMO) | Source: Ambulatory Visit

## 2018-11-25 ENCOUNTER — Encounter: Payer: Self-pay | Admitting: Internal Medicine

## 2018-11-25 ENCOUNTER — Other Ambulatory Visit: Payer: Self-pay

## 2018-11-25 ENCOUNTER — Ambulatory Visit (INDEPENDENT_AMBULATORY_CARE_PROVIDER_SITE_OTHER): Payer: Managed Care, Other (non HMO) | Admitting: Internal Medicine

## 2018-11-25 DIAGNOSIS — Z23 Encounter for immunization: Secondary | ICD-10-CM | POA: Diagnosis not present

## 2018-11-25 NOTE — Progress Notes (Signed)
Flu vaccine by CMA 

## 2018-11-25 NOTE — Patient Instructions (Signed)
Patient received a flu vaccine IM L deltoid, AV, CMA  

## 2018-12-08 ENCOUNTER — Ambulatory Visit (INDEPENDENT_AMBULATORY_CARE_PROVIDER_SITE_OTHER): Payer: Managed Care, Other (non HMO) | Admitting: Internal Medicine

## 2018-12-08 ENCOUNTER — Encounter: Payer: Self-pay | Admitting: Internal Medicine

## 2018-12-08 ENCOUNTER — Other Ambulatory Visit: Payer: Self-pay

## 2018-12-08 VITALS — BP 120/80 | HR 68 | Temp 98.2°F | Ht 63.0 in | Wt 212.0 lb

## 2018-12-08 DIAGNOSIS — Z23 Encounter for immunization: Secondary | ICD-10-CM

## 2018-12-08 NOTE — Patient Instructions (Signed)
Patient received a pneumonia 13 vaccine IM R deltoid.

## 2018-12-08 NOTE — Progress Notes (Signed)
Prevnar 13 injection given by CMA

## 2018-12-21 ENCOUNTER — Ambulatory Visit: Payer: Managed Care, Other (non HMO)

## 2019-01-12 DIAGNOSIS — R350 Frequency of micturition: Secondary | ICD-10-CM | POA: Diagnosis not present

## 2019-02-09 ENCOUNTER — Telehealth: Payer: Self-pay | Admitting: Internal Medicine

## 2019-02-09 NOTE — Telephone Encounter (Signed)
Brandi Berry [phone]  [facility] [id number] Brandi Berry, [place] Phone:  [phone]  Fax:  [phone]      Brandi Berry would like a prescription for the shingle shot [facility] the [facility]

## 2019-02-09 NOTE — Telephone Encounter (Signed)
Tell her I would not advise getting this vaccine now as there is a 25% chance of significant flu like side effects with  at least one of the 2 shingles vaccines which are given 2 months apart. Wait until after the pandemic

## 2019-02-09 NOTE — Telephone Encounter (Signed)
Called and let patient know what Dr Baxley said, she verbalized understanding 

## 2019-02-20 ENCOUNTER — Other Ambulatory Visit: Payer: Self-pay

## 2019-02-20 ENCOUNTER — Other Ambulatory Visit: Payer: Medicare Other | Admitting: Internal Medicine

## 2019-02-20 DIAGNOSIS — Z0000 Encounter for general adult medical examination without abnormal findings: Secondary | ICD-10-CM | POA: Diagnosis not present

## 2019-02-20 DIAGNOSIS — K219 Gastro-esophageal reflux disease without esophagitis: Secondary | ICD-10-CM | POA: Diagnosis not present

## 2019-02-20 DIAGNOSIS — R7309 Other abnormal glucose: Secondary | ICD-10-CM | POA: Diagnosis not present

## 2019-02-20 DIAGNOSIS — M545 Low back pain: Secondary | ICD-10-CM | POA: Diagnosis not present

## 2019-02-20 DIAGNOSIS — E7800 Pure hypercholesterolemia, unspecified: Secondary | ICD-10-CM | POA: Diagnosis not present

## 2019-02-21 LAB — COMPLETE METABOLIC PANEL WITH GFR
AG Ratio: 1.6 (calc) (ref 1.0–2.5)
ALT: 9 U/L (ref 6–29)
AST: 16 U/L (ref 10–35)
Albumin: 3.8 g/dL (ref 3.6–5.1)
Alkaline phosphatase (APISO): 83 U/L (ref 37–153)
BUN: 15 mg/dL (ref 7–25)
CO2: 25 mmol/L (ref 20–32)
Calcium: 8.9 mg/dL (ref 8.6–10.4)
Chloride: 107 mmol/L (ref 98–110)
Creat: 0.77 mg/dL (ref 0.50–0.99)
GFR, Est African American: 94 mL/min/{1.73_m2} (ref >=60)
GFR, Est Non African American: 81 mL/min/{1.73_m2} (ref >=60)
Globulin: 2.4 g/dL (calc) (ref 1.9–3.7)
Glucose, Bld: 77 mg/dL (ref 65–99)
Potassium: 4.2 mmol/L (ref 3.5–5.3)
Sodium: 142 mmol/L (ref 135–146)
Total Bilirubin: 0.6 mg/dL (ref 0.2–1.2)
Total Protein: 6.2 g/dL (ref 6.1–8.1)

## 2019-02-21 LAB — CBC WITH DIFFERENTIAL/PLATELET
Absolute Monocytes: 442 cells/uL (ref 200–950)
Basophils Absolute: 31 cells/uL (ref 0–200)
Basophils Relative: 0.6 %
Eosinophils Absolute: 88 cells/uL (ref 15–500)
Eosinophils Relative: 1.7 %
HCT: 41.8 % (ref 35.0–45.0)
Hemoglobin: 14.1 g/dL (ref 11.7–15.5)
Lymphs Abs: 1238 cells/uL (ref 850–3900)
MCH: 31.1 pg (ref 27.0–33.0)
MCHC: 33.7 g/dL (ref 32.0–36.0)
MCV: 92.3 fL (ref 80.0–100.0)
MPV: 10.1 fL (ref 7.5–12.5)
Monocytes Relative: 8.5 %
Neutro Abs: 3401 cells/uL (ref 1500–7800)
Neutrophils Relative %: 65.4 %
Platelets: 271 10*3/uL (ref 140–400)
RBC: 4.53 10*6/uL (ref 3.80–5.10)
RDW: 12.5 % (ref 11.0–15.0)
Total Lymphocyte: 23.8 %
WBC: 5.2 10*3/uL (ref 3.8–10.8)

## 2019-02-21 LAB — LIPID PANEL
Cholesterol: 223 mg/dL — ABNORMAL HIGH (ref <200)
HDL: 71 mg/dL (ref >=50)
LDL Cholesterol (Calc): 135 mg/dL (calc) — ABNORMAL HIGH
Non-HDL Cholesterol (Calc): 152 mg/dL (calc) — ABNORMAL HIGH (ref <130)
Total CHOL/HDL Ratio: 3.1 (calc) (ref <5.0)
Triglycerides: 74 mg/dL (ref <150)

## 2019-02-21 LAB — TSH: TSH: 1.77 mIU/L (ref 0.40–4.50)

## 2019-02-21 LAB — HEMOGLOBIN A1C
Hgb A1c MFr Bld: 5.2 % of total Hgb (ref <5.7)
Mean Plasma Glucose: 103 (calc)
eAG (mmol/L): 5.7 (calc)

## 2019-02-28 ENCOUNTER — Ambulatory Visit (INDEPENDENT_AMBULATORY_CARE_PROVIDER_SITE_OTHER): Payer: Medicare Other | Admitting: Internal Medicine

## 2019-02-28 ENCOUNTER — Encounter: Payer: Self-pay | Admitting: Internal Medicine

## 2019-02-28 ENCOUNTER — Other Ambulatory Visit: Payer: Self-pay

## 2019-02-28 VITALS — BP 120/70 | HR 67 | Temp 98.0°F | Ht 63.0 in | Wt 212.0 lb

## 2019-02-28 DIAGNOSIS — M545 Low back pain, unspecified: Secondary | ICD-10-CM

## 2019-02-28 DIAGNOSIS — Z8669 Personal history of other diseases of the nervous system and sense organs: Secondary | ICD-10-CM

## 2019-02-28 DIAGNOSIS — K219 Gastro-esophageal reflux disease without esophagitis: Secondary | ICD-10-CM

## 2019-02-28 DIAGNOSIS — Z0000 Encounter for general adult medical examination without abnormal findings: Secondary | ICD-10-CM

## 2019-02-28 DIAGNOSIS — Z23 Encounter for immunization: Secondary | ICD-10-CM

## 2019-02-28 DIAGNOSIS — Z8709 Personal history of other diseases of the respiratory system: Secondary | ICD-10-CM | POA: Diagnosis not present

## 2019-02-28 DIAGNOSIS — E7800 Pure hypercholesterolemia, unspecified: Secondary | ICD-10-CM

## 2019-02-28 LAB — POCT URINALYSIS DIPSTICK
Appearance: NEGATIVE
Bilirubin, UA: NEGATIVE
Blood, UA: NEGATIVE
Glucose, UA: NEGATIVE
Ketones, UA: NEGATIVE
Leukocytes, UA: NEGATIVE
Nitrite, UA: NEGATIVE
Odor: NEGATIVE
Protein, UA: NEGATIVE
Spec Grav, UA: 1.015 (ref 1.010–1.025)
Urobilinogen, UA: 0.2 E.U./dL
pH, UA: 6 (ref 5.0–8.0)

## 2019-02-28 MED ORDER — CYCLOBENZAPRINE HCL 10 MG PO TABS: 10.0000 mg | ORAL_TABLET | Freq: Three times a day (TID) | ORAL | 3 refills | Status: DC | PRN

## 2019-02-28 MED ORDER — HYDROMORPHONE HCL 2 MG PO TABS: ORAL_TABLET | ORAL | 0 refills | Status: DC

## 2019-02-28 NOTE — Progress Notes (Signed)
Subjective:    Patient ID: Brandi Berry, female    DOB: 10/12/1955, 63 y.o.   MRN: [id number]  HPI 63 year old Female for Welcome to Medicare preventive visit, health maintenance exam and evaluation of medical issues.  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  She lost her husband to lung cancer in 2019.  History of urinary frequency and has seen urologist regarding stress and urge urinary incontinence.  History of benign microscopic hematuria.  History of migraine headaches.  She has a protracted migraine generally takes prednisone to resolve.  She saw Dr. Jaynee Eagles in 2018.  She had an MRI showing no significant change from MRI in 2011.  She is allergic to penicillin.  Social history: Formerly smoked less than a pack of cigarettes daily in the 1990s.  Social alcohol consumption.  She has a Scientist, water quality.  1 son.  Had allergy skin testing in 2004 reviewing positive skin test and moles.  Left ectopic pregnancy 1996  Past GYN physician  Saw podiatrist in 2005 and had hammertoe deformity  Family history: Sister and mother living.  Father deceased.  Father had stroke and was an alcoholic.  Welcome to Medicare EKG shows no significant change from last EKG done in the emergency department with chest pain in March 2020.  Labs reviewed and total cholesterol has improved from 2 55-2 23.  LDL cholesterol has improved from 1 63-1 35.  She will continue to work on diet and exercise.  Fasting glucose is normal.  Hemoglobin A1c normal.  CBC and C met normal.  Review of Systems GERD symptoms, migraine headaches, musculoskeletal pain mainly back pain with no relief with anti-inflammatory medication.  Willing to try Flexeril with Dilaudid which she says has worked in the past     Objective:   Physical Exam Blood pressure 120/70, pulse 67, temperature 98 degrees orally pulse oximetry 98% BMI 37.5 weight 212 pounds height 5 feet 3  inches  Skin warm and dry.  Nodes none.  TMs clear.  Neck is supple without JVD thyromegaly or carotid bruits.  Chest clear to auscultation.  Cardiac exam regular rate and rhythm normal S1 and S2 without murmurs or gallops.  Breast without masses.  Abdomen soft nondistended without hepatosplenomegaly masses or tenderness.  Pelvic exam deferred to GYN physician.  No lower extremity edema or deformity.  Neuro no focal deficits.  Affect thought and judgment are within normal limits.       Assessment & Plan:  Welcome to Medicare exam  History of migraine headaches  Back pain/musculoskeletal pain.  Have prescribed Flexeril 10 mg at bedtime in the past for musculoskeletal pain.  Says anti-inflammatory medicine is not effective.  Refill Dilaudid 2 mg to take sparingly up to 3 times daily every 8 hours as needed for pain.  This is also effective for her with migraine headaches.  Elevated LDL-work on diet and exercise.  GE reflux-recommend Pepcid  Prevnar 13 given in October.  Tdap given today.  Prescription for Shingrix vaccine.  Had flu vaccine in September.  Return in 1 year or as needed.  Subjective:   Patient presents for Medicare Annual/Subsequent preventive examination.  Review Past Medical/Family/Social: See above   Risk Factors  Current exercise habits: Hypercholesterolemia Dietary issues discussed: Low-fat low carbohydrate  Cardiac risk factors: Hyperlipidemia  Depression Screen  (Note: if answer to either of the following is "Yes", a more complete depression screening is indicated)   Over  the past two weeks, have you felt down, depressed or hopeless? No  Over the past two weeks, have you felt little interest or pleasure in doing things? No Have you lost interest or pleasure in daily life? No Do you often feel hopeless? No Do you cry easily over simple problems? No   Activities of Daily Living  In your present state of health, do you have any difficulty performing the  following activities?:   Driving? No  Managing money? No  Feeding yourself? No  Getting from bed to chair? No  Climbing a flight of stairs? No  Preparing food and eating?: No  Bathing or showering? No  Getting dressed: No  Getting to the toilet? No  Using the toilet:No  Moving around from place to place: No  In the past year have you fallen or had a near fall?:No  Are you sexually active? No  Do you have more than one partner? No   Hearing Difficulties: No  Do you often ask people to speak up or repeat themselves? No  Do you experience ringing or noises in your ears? No  Do you have difficulty understanding soft or whispered voices? No  Do you feel that you have a problem with memory? No Do you often misplace items? No    Home Safety:  Do you have a smoke alarm at your residence? Yes Do you have grab bars in the bathroom?  None Do you have throw rugs in your house?  Yes  Cognitive Testing  Alert? Yes Normal Appearance?Yes  Oriented to person? Yes Place? Yes  Time? Yes  Recall of three objects? Yes  Can perform simple calculations? Yes  Displays appropriate judgment?Yes  Can read the correct time from a watch face?Yes   List the Names of Other Physician/Practitioners you currently use:  See referral list for the physicians patient is currently seeing.  Has seen urologist  Has GYN physician   Review of Systems: See above   Objective:     General appearance: Appears younger than stated age and mildly obese  Head: Normocephalic, without obvious abnormality, atraumatic  Eyes: conj clear, EOMi PEERLA  Ears: normal TM's and external ear canals both ears  Nose: Nares normal. Septum midline. Mucosa normal. No drainage or sinus tenderness.  Throat: lips, mucosa, and tongue normal; teeth and gums normal  Neck: no adenopathy, no carotid bruit, no JVD, supple, symmetrical, trachea midline and thyroid not enlarged, symmetric, no tenderness/mass/nodules  No CVA tenderness.   Lungs: clear to auscultation bilaterally  Breasts: normal appearance, no masses or tenderness Heart: regular rate and rhythm, S1, S2 normal, no murmur, click, rub or gallop  Abdomen: soft, non-tender; bowel sounds normal; no masses, no organomegaly  Musculoskeletal: ROM normal in all joints, no crepitus, no deformity, Normal muscle strengthen. Back  is symmetric, no curvature. Skin: Skin color, texture, turgor normal. No rashes or lesions  Lymph nodes: Cervical, supraclavicular, and axillary nodes normal.  Neurologic: CN 2 -12 Normal, Normal symmetric reflexes. Normal coordination and gait  Psych: Alert & Oriented x 3, Mood appear stable.    Assessment:    Annual wellness medicare exam   Plan:    During the course of the visit the patient was educated and counseled about appropriate screening and preventive services including:  Annual mammogram  Annual flu vaccine  Tetanus immunization update given  Has had Prevnar 13      Patient Instructions (the written plan) was given to the patient.  Medicare Attestation  I have personally reviewed:  The patient's medical and social history  Their use of alcohol, tobacco or illicit drugs  Their current medications and supplements  The patient's functional ability including ADLs,fall risks, home safety risks, cognitive, and hearing and visual impairment  Diet and physical activities  Evidence for depression or mood disorders  The patient's weight, height, BMI, and visual acuity have been recorded in the chart. I have made referrals, counseling, and provided education to the patient based on review of the above and I have provided the patient with a written personalized care plan for preventive services.

## 2019-02-28 NOTE — Patient Instructions (Addendum)
It was a pleasure to see you today. Watch diet and exercise and RTC in one year or prn. Pepcid for GERD. Dilaudid sparingly for back pain. Shingrix Rx given. Tetanus update given.

## 2019-03-14 DIAGNOSIS — R350 Frequency of micturition: Secondary | ICD-10-CM | POA: Diagnosis not present

## 2019-04-04 DIAGNOSIS — R350 Frequency of micturition: Secondary | ICD-10-CM | POA: Diagnosis not present

## 2019-05-02 DIAGNOSIS — R350 Frequency of micturition: Secondary | ICD-10-CM | POA: Diagnosis not present

## 2019-05-22 DIAGNOSIS — Z1231 Encounter for screening mammogram for malignant neoplasm of breast: Secondary | ICD-10-CM | POA: Diagnosis not present

## 2019-06-06 DIAGNOSIS — R350 Frequency of micturition: Secondary | ICD-10-CM | POA: Diagnosis not present

## 2019-06-27 DIAGNOSIS — R350 Frequency of micturition: Secondary | ICD-10-CM | POA: Diagnosis not present

## 2019-08-18 DIAGNOSIS — R350 Frequency of micturition: Secondary | ICD-10-CM | POA: Diagnosis not present

## 2019-09-15 DIAGNOSIS — R350 Frequency of micturition: Secondary | ICD-10-CM | POA: Diagnosis not present

## 2019-10-31 ENCOUNTER — Telehealth: Payer: Self-pay | Admitting: Internal Medicine

## 2019-10-31 NOTE — Telephone Encounter (Signed)
LVM that RX is ready for pick up

## 2019-10-31 NOTE — Telephone Encounter (Signed)
Order written

## 2019-10-31 NOTE — Telephone Encounter (Addendum)
Brandi Berry [phone]  Brandi Few called to say she would like to get a prescription for the shingles vaccine. She would like to get that before she gets the booster for the COVID-19 vaccine.

## 2019-11-15 NOTE — Telephone Encounter (Signed)
Picked up and CPE scheduled

## 2019-12-19 DIAGNOSIS — L900 Lichen sclerosus et atrophicus: Secondary | ICD-10-CM | POA: Diagnosis not present

## 2019-12-25 ENCOUNTER — Other Ambulatory Visit: Payer: Self-pay

## 2019-12-25 ENCOUNTER — Ambulatory Visit: Payer: Medicare Other | Admitting: Internal Medicine

## 2019-12-25 ENCOUNTER — Encounter: Payer: Self-pay | Admitting: Internal Medicine

## 2019-12-25 VITALS — Ht 63.0 in | Wt 212.0 lb

## 2020-01-01 DIAGNOSIS — H35341 Macular cyst, hole, or pseudohole, right eye: Secondary | ICD-10-CM | POA: Diagnosis not present

## 2020-02-13 DIAGNOSIS — L80 Vitiligo: Secondary | ICD-10-CM | POA: Diagnosis not present

## 2020-02-13 DIAGNOSIS — L900 Lichen sclerosus et atrophicus: Secondary | ICD-10-CM | POA: Diagnosis not present

## 2020-02-26 ENCOUNTER — Other Ambulatory Visit: Payer: Medicare Other | Admitting: Internal Medicine

## 2020-02-29 ENCOUNTER — Encounter: Payer: Medicare Other | Admitting: Internal Medicine

## 2020-05-13 DIAGNOSIS — L900 Lichen sclerosus et atrophicus: Secondary | ICD-10-CM | POA: Diagnosis not present

## 2020-07-17 DIAGNOSIS — N3000 Acute cystitis without hematuria: Secondary | ICD-10-CM | POA: Diagnosis not present

## 2020-07-17 DIAGNOSIS — R350 Frequency of micturition: Secondary | ICD-10-CM | POA: Diagnosis not present

## 2020-10-08 DIAGNOSIS — Z23 Encounter for immunization: Secondary | ICD-10-CM | POA: Diagnosis not present

## 2020-10-17 ENCOUNTER — Encounter: Payer: Self-pay | Admitting: Internal Medicine

## 2020-11-20 DIAGNOSIS — R350 Frequency of micturition: Secondary | ICD-10-CM | POA: Diagnosis not present

## 2020-12-25 DIAGNOSIS — H35372 Puckering of macula, left eye: Secondary | ICD-10-CM | POA: Diagnosis not present

## 2021-02-10 DIAGNOSIS — H2511 Age-related nuclear cataract, right eye: Secondary | ICD-10-CM | POA: Diagnosis not present

## 2021-02-10 DIAGNOSIS — H35341 Macular cyst, hole, or pseudohole, right eye: Secondary | ICD-10-CM | POA: Diagnosis not present

## 2021-02-10 DIAGNOSIS — H43813 Vitreous degeneration, bilateral: Secondary | ICD-10-CM | POA: Diagnosis not present

## 2021-02-10 DIAGNOSIS — H25013 Cortical age-related cataract, bilateral: Secondary | ICD-10-CM | POA: Diagnosis not present

## 2021-02-10 DIAGNOSIS — H2513 Age-related nuclear cataract, bilateral: Secondary | ICD-10-CM | POA: Diagnosis not present

## 2021-02-10 DIAGNOSIS — H35372 Puckering of macula, left eye: Secondary | ICD-10-CM | POA: Diagnosis not present

## 2021-02-10 DIAGNOSIS — H25043 Posterior subcapsular polar age-related cataract, bilateral: Secondary | ICD-10-CM | POA: Diagnosis not present

## 2021-03-25 DIAGNOSIS — H25811 Combined forms of age-related cataract, right eye: Secondary | ICD-10-CM | POA: Diagnosis not present

## 2021-03-25 DIAGNOSIS — H43813 Vitreous degeneration, bilateral: Secondary | ICD-10-CM | POA: Diagnosis not present

## 2021-03-25 DIAGNOSIS — H2511 Age-related nuclear cataract, right eye: Secondary | ICD-10-CM | POA: Diagnosis not present

## 2021-03-25 DIAGNOSIS — H25042 Posterior subcapsular polar age-related cataract, left eye: Secondary | ICD-10-CM | POA: Diagnosis not present

## 2021-03-25 DIAGNOSIS — H2512 Age-related nuclear cataract, left eye: Secondary | ICD-10-CM | POA: Diagnosis not present

## 2021-04-01 DIAGNOSIS — H2512 Age-related nuclear cataract, left eye: Secondary | ICD-10-CM | POA: Diagnosis not present

## 2021-04-02 HISTORY — PX: CATARACT EXTRACTION: SUR2

## 2021-04-28 ENCOUNTER — Ambulatory Visit (INDEPENDENT_AMBULATORY_CARE_PROVIDER_SITE_OTHER): Payer: Medicare Other | Admitting: Internal Medicine

## 2021-04-28 ENCOUNTER — Telehealth: Payer: Self-pay | Admitting: Internal Medicine

## 2021-04-28 ENCOUNTER — Encounter: Payer: Self-pay | Admitting: Internal Medicine

## 2021-04-28 ENCOUNTER — Other Ambulatory Visit: Payer: Self-pay

## 2021-04-28 VITALS — BP 118/78 | HR 88 | Temp 98.3°F | Wt 220.0 lb

## 2021-04-28 DIAGNOSIS — Z780 Asymptomatic menopausal state: Secondary | ICD-10-CM

## 2021-04-28 DIAGNOSIS — M7711 Lateral epicondylitis, right elbow: Secondary | ICD-10-CM | POA: Diagnosis not present

## 2021-04-28 MED ORDER — MELOXICAM 15 MG PO TABS: 15.0000 mg | ORAL_TABLET | Freq: Every day | ORAL | 2 refills | Status: DC

## 2021-04-28 NOTE — Progress Notes (Signed)
° °  Subjective:    Patient ID: Brandi Berry, female    DOB: 05-05-1955, 66 y.o.   MRN: [id number]  HPI Pleasant 66 year old female seen today for right arm and elbow pain.  No known injury.  However it is quite painful at times.  Lifting heavy items seems to exacerbate the pain.  She is retired from [employer] but continues to work part-time at [employer] is at [employer] [place].  Allergy skin test in 2004 revealed positive test to molds.  History of migraine headaches for which she is seeing Dr. Jaynee Eagles in 2018.  History of mixed stress and urge urinary incontinence seen by urology in 2010.  She has tried multiple medications which did not work all that well.  Sister is now living with her since patient's husband passed away.  Father with history of stroke.  Review of Systems See above denies injury or lots of heavy lifting with upper extremities    Objective:   Physical Exam Blood pressure 118/78, pulse 88 regular, temperature 98.3 degrees, pulse oximetry 98%, weight 220 pounds.  BMI 38.97.  She has tenderness over right lateral epicondyles.  Range of motion of the right upper extremity is normal.  Muscle strength in the right upper extremity is normal as is sensation.       Assessment & Plan:   Impression: Right lateral epicondylitis  Plan: Trial of meloxicam 15 mg daily for 4 weeks.  Avoid heavy lifting or repetitive motion with the right upper extremity.  May use ice to the area as well for 20 minutes a couple of times a day.  If not improving in a month, consider injection.

## 2021-04-28 NOTE — Telephone Encounter (Signed)
Brandi Berry [address] [phone]  Brandi Berry called to say for the last couple of weeks she has been having Right arm and elbow pain. She would like to come in to be seen.

## 2021-04-28 NOTE — Telephone Encounter (Signed)
scheduled

## 2021-04-29 NOTE — Patient Instructions (Addendum)
May apply ice to right lateral epicondyles on a as needed basis for 20 minutes at a time.  Take meloxicam 15 mg daily.  If not improved in 4 weeks, consider injection.  Avoid heavy lifting and repetitive motion with right upper extremity.  Bone density study ordered.

## 2021-05-13 DIAGNOSIS — L900 Lichen sclerosus et atrophicus: Secondary | ICD-10-CM | POA: Diagnosis not present

## 2021-05-13 DIAGNOSIS — L814 Other melanin hyperpigmentation: Secondary | ICD-10-CM | POA: Diagnosis not present

## 2021-05-13 DIAGNOSIS — D235 Other benign neoplasm of skin of trunk: Secondary | ICD-10-CM | POA: Diagnosis not present

## 2021-05-13 DIAGNOSIS — L821 Other seborrheic keratosis: Secondary | ICD-10-CM | POA: Diagnosis not present

## 2021-05-13 DIAGNOSIS — L80 Vitiligo: Secondary | ICD-10-CM | POA: Diagnosis not present

## 2021-05-13 DIAGNOSIS — D2371 Other benign neoplasm of skin of right lower limb, including hip: Secondary | ICD-10-CM | POA: Diagnosis not present

## 2021-05-23 ENCOUNTER — Other Ambulatory Visit: Payer: Self-pay | Admitting: Internal Medicine

## 2021-05-23 DIAGNOSIS — Z1231 Encounter for screening mammogram for malignant neoplasm of breast: Secondary | ICD-10-CM

## 2021-05-27 ENCOUNTER — Other Ambulatory Visit: Payer: Self-pay

## 2021-05-27 ENCOUNTER — Ambulatory Visit
Admission: RE | Admit: 2021-05-27 | Discharge: 2021-05-27 | Disposition: A | Discharge status: Home / Self Care | Payer: Medicare Other | Source: Ambulatory Visit | Attending: Internal Medicine | Admitting: Internal Medicine

## 2021-05-27 DIAGNOSIS — Z1231 Encounter for screening mammogram for malignant neoplasm of breast: Secondary | ICD-10-CM | POA: Diagnosis not present

## 2021-05-29 ENCOUNTER — Other Ambulatory Visit: Payer: Self-pay | Admitting: Internal Medicine

## 2021-05-29 DIAGNOSIS — R928 Other abnormal and inconclusive findings on diagnostic imaging of breast: Secondary | ICD-10-CM

## 2021-06-10 ENCOUNTER — Ambulatory Visit
Admission: RE | Admit: 2021-06-10 | Discharge: 2021-06-10 | Disposition: A | Discharge status: Home / Self Care | Payer: Medicare Other | Source: Ambulatory Visit | Attending: Internal Medicine | Admitting: Internal Medicine

## 2021-06-10 ENCOUNTER — Other Ambulatory Visit: Payer: Self-pay | Admitting: Internal Medicine

## 2021-06-10 ENCOUNTER — Telehealth: Payer: Self-pay

## 2021-06-10 DIAGNOSIS — R928 Other abnormal and inconclusive findings on diagnostic imaging of breast: Secondary | ICD-10-CM

## 2021-06-10 DIAGNOSIS — R921 Mammographic calcification found on diagnostic imaging of breast: Secondary | ICD-10-CM | POA: Diagnosis not present

## 2021-06-10 DIAGNOSIS — R922 Inconclusive mammogram: Secondary | ICD-10-CM | POA: Diagnosis not present

## 2021-06-10 NOTE — Telephone Encounter (Signed)
Scheduled Friday at 1030 ?

## 2021-06-10 NOTE — Telephone Encounter (Signed)
Patient would like to come in and get injection for tennis elbow. She was seen  about a month ago and told if it cam eback to do a shot. Please advise.  ?

## 2021-06-13 ENCOUNTER — Ambulatory Visit (INDEPENDENT_AMBULATORY_CARE_PROVIDER_SITE_OTHER): Payer: Medicare Other | Admitting: Internal Medicine

## 2021-06-13 VITALS — BP 126/86 | HR 87 | Temp 97.5°F | Ht 63.0 in | Wt 217.2 lb

## 2021-06-13 DIAGNOSIS — M7711 Lateral epicondylitis, right elbow: Secondary | ICD-10-CM

## 2021-06-13 MED ORDER — METHYLPREDNISOLONE ACETATE 80 MG/ML IJ SUSP
40.0000 mg | Freq: Once | INTRAMUSCULAR | Status: AC
  Administered 2021-06-13: 40 mg via INTRA_ARTICULAR

## 2021-06-13 NOTE — Progress Notes (Signed)
? ?  Subjective:  ? ? Patient ID: Brandi Berry, female    DOB: Sep 09, 1955, 66 y.o.   MRN: [id number] ? ?HPI 66 year old Female seen with right elbow pain.  Not aware of any injury or significant heavy lifting to cause this pain.  It is quite bothersome and she is right-handed.  No redness of the elbow joint. ? ?Was seen February 27th for this issue and was diagnosed with right tennis elbow.  She  was started on Meloxicam 15 mg daily for 4 weeks.  Was told to avoid heavy lifting and repetitive motions of the right upper extremity.  Recommended to use ice for 20 minutes a couple of times a day to the elbow area. ? ?She reports no significant improvement in the pain continues to be aggravating. ? ? ? ?Review of Systems see above ? ?   ?Objective:  ? Physical Exam ?Blood pressure 126/86 pulse 87 temperature 97.5 degrees by tympanic thermometer pulse oximetry 96% weight 271 pounds 4 ounces BMI 38.48 ? ? ? ?She has tenderness over right lateral epicondyle.  Olecranon is not red or swollen.  No olecranon effusion.  Medial epicondyles on the right is nontender. ? ?   ?Assessment & Plan:  ?Right lateral epicondylitis-did not respond to have meloxicam ? ?Plan: Explained to her that this is a common occurrence.  Could have been aggravated by some lifting or activity at work. ? ?The right lateral epicondyle area was injected with Depo-Medrol.  She will continue to ice the area.  May continue to take meloxicam.  Call if not improving.  May need to see orthopedist if not improving in 2 to 4 weeks. ? ?

## 2021-06-19 DIAGNOSIS — R350 Frequency of micturition: Secondary | ICD-10-CM | POA: Diagnosis not present

## 2021-06-25 ENCOUNTER — Encounter: Payer: Self-pay | Admitting: Internal Medicine

## 2021-06-25 NOTE — Patient Instructions (Addendum)
Right lateral epicondyles was injected with  Depo-Medrol.  Continue to ice area down for 20 minutes twice daily.  May continue with meloxicam.  If not improving in 2 weeks or so we will consider orthopedic referral.  Avoid heavy lifting. ?

## 2021-08-12 ENCOUNTER — Other Ambulatory Visit: Payer: Medicare Other

## 2021-08-12 ENCOUNTER — Other Ambulatory Visit: Payer: Self-pay | Admitting: Internal Medicine

## 2021-08-14 ENCOUNTER — Other Ambulatory Visit: Payer: Medicare Other

## 2021-08-14 DIAGNOSIS — Z136 Encounter for screening for cardiovascular disorders: Secondary | ICD-10-CM

## 2021-08-14 DIAGNOSIS — R5383 Other fatigue: Secondary | ICD-10-CM

## 2021-08-14 DIAGNOSIS — Z13220 Encounter for screening for lipoid disorders: Secondary | ICD-10-CM | POA: Diagnosis not present

## 2021-08-14 DIAGNOSIS — Z79899 Other long term (current) drug therapy: Secondary | ICD-10-CM | POA: Diagnosis not present

## 2021-08-15 LAB — CBC WITH DIFFERENTIAL/PLATELET
Absolute Monocytes: 421 cells/uL (ref 200–950)
Basophils Absolute: 49 cells/uL (ref 0–200)
Basophils Relative: 1 %
Eosinophils Absolute: 59 cells/uL (ref 15–500)
Eosinophils Relative: 1.2 %
HCT: 39.8 % (ref 35.0–45.0)
Hemoglobin: 13.5 g/dL (ref 11.7–15.5)
Lymphs Abs: 1397 cells/uL (ref 850–3900)
MCH: 31.4 pg (ref 27.0–33.0)
MCHC: 33.9 g/dL (ref 32.0–36.0)
MCV: 92.6 fL (ref 80.0–100.0)
MPV: 10.3 fL (ref 7.5–12.5)
Monocytes Relative: 8.6 %
Neutro Abs: 2974 cells/uL (ref 1500–7800)
Neutrophils Relative %: 60.7 %
Platelets: 294 10*3/uL (ref 140–400)
RBC: 4.3 10*6/uL (ref 3.80–5.10)
RDW: 12.7 % (ref 11.0–15.0)
Total Lymphocyte: 28.5 %
WBC: 4.9 10*3/uL (ref 3.8–10.8)

## 2021-08-15 LAB — COMPLETE METABOLIC PANEL WITH GFR
AG Ratio: 1.6 (calc) (ref 1.0–2.5)
ALT: 8 U/L (ref 6–29)
AST: 14 U/L (ref 10–35)
Albumin: 4 g/dL (ref 3.6–5.1)
Alkaline phosphatase (APISO): 82 U/L (ref 37–153)
BUN: 12 mg/dL (ref 7–25)
CO2: 28 mmol/L (ref 20–32)
Calcium: 9.3 mg/dL (ref 8.6–10.4)
Chloride: 107 mmol/L (ref 98–110)
Creat: 0.74 mg/dL (ref 0.50–1.05)
Globulin: 2.5 g/dL (calc) (ref 1.9–3.7)
Glucose, Bld: 80 mg/dL (ref 65–99)
Potassium: 4.7 mmol/L (ref 3.5–5.3)
Sodium: 142 mmol/L (ref 135–146)
Total Bilirubin: 0.5 mg/dL (ref 0.2–1.2)
Total Protein: 6.5 g/dL (ref 6.1–8.1)
eGFR: 89 mL/min/{1.73_m2} (ref >=60)

## 2021-08-15 LAB — LIPID PANEL
Cholesterol: 230 mg/dL — ABNORMAL HIGH (ref <200)
HDL: 75 mg/dL (ref >=50)
LDL Cholesterol (Calc): 138 mg/dL (calc) — ABNORMAL HIGH
Non-HDL Cholesterol (Calc): 155 mg/dL (calc) — ABNORMAL HIGH (ref <130)
Total CHOL/HDL Ratio: 3.1 (calc) (ref <5.0)
Triglycerides: 71 mg/dL (ref <150)

## 2021-08-15 LAB — TSH: TSH: 3.1 mIU/L (ref 0.40–4.50)

## 2021-08-19 ENCOUNTER — Encounter: Payer: Self-pay | Admitting: Internal Medicine

## 2021-08-19 ENCOUNTER — Ambulatory Visit (INDEPENDENT_AMBULATORY_CARE_PROVIDER_SITE_OTHER): Payer: Medicare Other | Admitting: Internal Medicine

## 2021-08-19 VITALS — BP 112/72 | HR 71 | Temp 97.9°F | Ht 60.25 in | Wt 216.8 lb

## 2021-08-19 DIAGNOSIS — N3946 Mixed incontinence: Secondary | ICD-10-CM

## 2021-08-19 DIAGNOSIS — Z9842 Cataract extraction status, left eye: Secondary | ICD-10-CM | POA: Diagnosis not present

## 2021-08-19 DIAGNOSIS — Z780 Asymptomatic menopausal state: Secondary | ICD-10-CM | POA: Diagnosis not present

## 2021-08-19 DIAGNOSIS — M7711 Lateral epicondylitis, right elbow: Secondary | ICD-10-CM | POA: Diagnosis not present

## 2021-08-19 DIAGNOSIS — Z6841 Body Mass Index (BMI) 40.0 and over, adult: Secondary | ICD-10-CM

## 2021-08-19 DIAGNOSIS — R82998 Other abnormal findings in urine: Secondary | ICD-10-CM

## 2021-08-19 DIAGNOSIS — Z9841 Cataract extraction status, right eye: Secondary | ICD-10-CM

## 2021-08-19 DIAGNOSIS — Z0000 Encounter for general adult medical examination without abnormal findings: Secondary | ICD-10-CM | POA: Diagnosis not present

## 2021-08-19 DIAGNOSIS — E7800 Pure hypercholesterolemia, unspecified: Secondary | ICD-10-CM

## 2021-08-19 LAB — POCT URINALYSIS DIPSTICK
Bilirubin, UA: NEGATIVE
Blood, UA: NEGATIVE
Glucose, UA: NEGATIVE
Ketones, UA: NEGATIVE
Nitrite, UA: NEGATIVE
Protein, UA: NEGATIVE
Spec Grav, UA: 1.015 (ref 1.010–1.025)
Urobilinogen, UA: 0.2 E.U./dL
pH, UA: 5 (ref 5.0–8.0)

## 2021-08-19 MED ORDER — ROSUVASTATIN CALCIUM 5 MG PO TABS: 5.0000 mg | ORAL_TABLET | Freq: Every day | ORAL | 3 refills | Status: DC

## 2021-08-19 MED ORDER — MELOXICAM 15 MG PO TABS: 15.0000 mg | ORAL_TABLET | Freq: Every day | ORAL | 0 refills | Status: DC

## 2021-08-19 NOTE — Progress Notes (Signed)
Annual Wellness Visit     Patient: Brandi Berry, Female    DOB: [birth year], 66 y.o.   MRN: [id number] Visit Date: 08/19/2021  Chief Complaint  Patient presents with   Medicare Wellness   Subjective    Brandi Berry is a 66 y.o. Female who presents today for her Annual Medicare Wellness Visit.  HPI Also presents for health maintenance exam and evaluation of medical issue.  Patient retired from [employer] and now working at [employer] part time.  Social history: She is a widow.  She lost her husband to lung cancer in 2019 and her  adult son age 60, to a drug overdose in November 2021.  Sister resides with her since her husband passed away.  Family history: Mother deceased.  Father with history of stroke.  History of migraine headaches and has seen Dr. Jaynee Berry in 2018.  Mixed stress and urge urinary incontinence seen by Dr. Wendy Berry in April 2023.  History of lichen sclerosis seen by dermatologist.  History of cataracts status post bilateral cataract surgery December 2022 in January 2023.  Has had some issues with lateral epicondylitis of right elbow.  Treated with anti-inflammatory medication in February 2023.  Also was given a steroid injection.  Still has some issues.  Meloxicam prescribed.      Patient Care Team: Brandi Showers, MD as PCP - General (Internal Medicine)  Review of Systems   Objective    Vitals: Blood pressure 112/72 pulse 71 temperature 97.9 degrees pulse oximetry 99% weight 216 pounds 12 ounces BMI 41.98  Physical Exam Skin: Warm and dry.  No cervical adenopathy.  TMs clear.  Pharynx clear.  Neck supple.  No carotid bruits.  No thyromegaly.  Chest clear.  Tender right lateral epicondyle.  Cardiac exam: Regular rate and rhythm without ectopy.  Abdomen soft nondistended without hepatosplenomegaly masses or tenderness.  No lower extremity pitting edema.  Neuro is intact without gross focal deficits.  Affect thought and judgment are  normal.  Urine dipstick is abnormal.  May not have been a clean-catch.  Urine culture is negative.  Most recent functional status assessment:    08/19/2021    9:43 AM  In your present state of health, do you have any difficulty performing the following activities:  Hearing? 0  Vision? 0  Difficulty concentrating or making decisions? 0  Walking or climbing stairs? 0  Dressing or bathing? 0  Doing errands, shopping? 0  Preparing Food and eating ? N  Using the Toilet? N  In the past six months, have you accidently leaked urine? N  Do you have problems with loss of bowel control? N  Managing your Medications? N  Managing your Finances? N  Housekeeping or managing your Housekeeping? N   Most recent fall risk assessment:    08/19/2021    9:43 AM  Fall Risk   Falls in the past year? 0  Number falls in past yr: 0  Injury with Fall? 0  Risk for fall due to : No Fall Risks  Follow up Falls evaluation completed    Most recent depression screenings:    04/28/2021    3:01 PM 02/28/2019   11:20 AM  PHQ 2/9 Scores  PHQ - 2 Score 0 0   Most recent cognitive screening:    08/19/2021    9:44 AM  6CIT Screen  What Year? 0 points  What month? 0 points  What time? 0 points  Count back from 20 0 points  Months in reverse 0 points  Repeat phrase 0 points  Total Score 0 points       Assessment & Plan   BMI 41.98-recommend diet exercise and weight loss.  Also could consider [facility].  Persistent right lateral epicondylitis.  Aggravated by lifting heavy items.-Prescribed meloxicam to take as needed.  May ice the elbow down 20 minutes daily.  If symptoms persist can see orthopedist.  Pure hypercholesterolemia-does not want to take statin medication  Immunizations: Recommend flu vaccine in the Fall, recommend COVID booster in the Fall with new variant coverage.  Recommend pneumococcal 20 vaccine.  Has had Shingrix vaccines.  Colonoscopy done in 2018. 10-year  follow-up recommended.  Return here in 1 year or as needed.     Annual wellness visit done today including the all of the following: Reviewed patient's Family Medical History Reviewed and updated list of patient's medical providers Assessment of cognitive impairment was done Assessed patient's functional ability Established a written schedule for health screening Brandi Berry Completed and Reviewed  Discussed health benefits of physical activity, and encouraged her to engage in regular exercise appropriate for her age and condition.         {I, Brandi Showers, MD, have reviewed all documentation for this visit. The documentation on 08/19/21 for the exam, diagnosis, procedures, and orders are all accurate and complete.   Brandi Berry, CMA

## 2021-08-20 LAB — URINE CULTURE
MICRO NUMBER:: 13547696
Result:: NO GROWTH
SPECIMEN QUALITY:: ADEQUATE

## 2021-09-24 NOTE — Patient Instructions (Addendum)
Please work on diet exercise and weight loss.  May take meloxicam sparingly for musculoskeletal pain/epicondylitis.  Patient declines to take statin medication for cholesterol.  Recheck in 1 year.  Order sent for mammogram and bone density study.

## 2021-10-23 DIAGNOSIS — R350 Frequency of micturition: Secondary | ICD-10-CM | POA: Diagnosis not present

## 2021-11-24 ENCOUNTER — Ambulatory Visit
Admission: RE | Admit: 2021-11-24 | Discharge: 2021-11-24 | Disposition: A | Discharge status: Home / Self Care | Payer: Medicare Other | Source: Ambulatory Visit | Attending: Internal Medicine | Admitting: Internal Medicine

## 2021-11-24 DIAGNOSIS — Z780 Asymptomatic menopausal state: Secondary | ICD-10-CM | POA: Diagnosis not present

## 2021-12-11 ENCOUNTER — Ambulatory Visit
Admission: RE | Admit: 2021-12-11 | Discharge: 2021-12-11 | Disposition: A | Discharge status: Home / Self Care | Payer: Medicare Other | Source: Ambulatory Visit | Attending: Internal Medicine | Admitting: Internal Medicine

## 2021-12-11 ENCOUNTER — Other Ambulatory Visit: Payer: Self-pay | Admitting: Internal Medicine

## 2021-12-11 DIAGNOSIS — R921 Mammographic calcification found on diagnostic imaging of breast: Secondary | ICD-10-CM | POA: Diagnosis not present

## 2021-12-23 DIAGNOSIS — H35341 Macular cyst, hole, or pseudohole, right eye: Secondary | ICD-10-CM | POA: Diagnosis not present

## 2022-05-14 DIAGNOSIS — L80 Vitiligo: Secondary | ICD-10-CM | POA: Diagnosis not present

## 2022-05-14 DIAGNOSIS — D2372 Other benign neoplasm of skin of left lower limb, including hip: Secondary | ICD-10-CM | POA: Diagnosis not present

## 2022-05-14 DIAGNOSIS — L900 Lichen sclerosus et atrophicus: Secondary | ICD-10-CM | POA: Diagnosis not present

## 2022-05-14 DIAGNOSIS — D2362 Other benign neoplasm of skin of left upper limb, including shoulder: Secondary | ICD-10-CM | POA: Diagnosis not present

## 2022-05-14 DIAGNOSIS — D2371 Other benign neoplasm of skin of right lower limb, including hip: Secondary | ICD-10-CM | POA: Diagnosis not present

## 2022-05-14 DIAGNOSIS — L814 Other melanin hyperpigmentation: Secondary | ICD-10-CM | POA: Diagnosis not present

## 2022-05-14 DIAGNOSIS — L821 Other seborrheic keratosis: Secondary | ICD-10-CM | POA: Diagnosis not present

## 2022-05-20 ENCOUNTER — Telehealth: Payer: Self-pay | Admitting: Internal Medicine

## 2022-05-20 NOTE — Telephone Encounter (Signed)
LVM to CB to schedule CPE due after 08/20/22, going to offer 09/09/2022 10:00 am

## 2022-06-11 DIAGNOSIS — R350 Frequency of micturition: Secondary | ICD-10-CM | POA: Diagnosis not present

## 2022-06-17 ENCOUNTER — Telehealth: Payer: Self-pay | Admitting: Internal Medicine

## 2022-06-17 NOTE — Telephone Encounter (Signed)
Licia [address] [phone]  Brandi Berry called to say the elbow pain she was having has now moved up into her shoulder so she wanted to know if she can come in and get a shot for that like she got for her elbow.

## 2022-06-18 NOTE — Telephone Encounter (Signed)
After talking with Dr Lenord Fellers she said patient would need to see an ortho doctor, so I called and let patient know. I told her about [facility], [facility] and also [facility]. She stated she may try [facility].

## 2022-06-23 DIAGNOSIS — M25511 Pain in right shoulder: Secondary | ICD-10-CM | POA: Diagnosis not present

## 2022-07-21 ENCOUNTER — Ambulatory Visit
Admission: RE | Admit: 2022-07-21 | Discharge: 2022-07-21 | Disposition: A | Discharge status: Home / Self Care | Payer: Medicare Other | Source: Ambulatory Visit | Attending: Internal Medicine | Admitting: Internal Medicine

## 2022-07-21 ENCOUNTER — Other Ambulatory Visit: Payer: Self-pay | Admitting: Internal Medicine

## 2022-07-21 DIAGNOSIS — R921 Mammographic calcification found on diagnostic imaging of breast: Secondary | ICD-10-CM

## 2022-07-21 DIAGNOSIS — R92333 Mammographic heterogeneous density, bilateral breasts: Secondary | ICD-10-CM | POA: Diagnosis not present

## 2022-07-22 DIAGNOSIS — M7541 Impingement syndrome of right shoulder: Secondary | ICD-10-CM | POA: Diagnosis not present

## 2022-07-28 ENCOUNTER — Ambulatory Visit
Admission: RE | Admit: 2022-07-28 | Discharge: 2022-07-28 | Disposition: A | Discharge status: Home / Self Care | Payer: Medicare Other | Source: Ambulatory Visit | Attending: Internal Medicine | Admitting: Internal Medicine

## 2022-07-28 DIAGNOSIS — R921 Mammographic calcification found on diagnostic imaging of breast: Secondary | ICD-10-CM

## 2022-07-30 ENCOUNTER — Ambulatory Visit
Admission: RE | Admit: 2022-07-30 | Discharge: 2022-07-30 | Disposition: A | Discharge status: Home / Self Care | Payer: Medicare Other | Source: Ambulatory Visit | Attending: Internal Medicine | Admitting: Internal Medicine

## 2022-07-30 DIAGNOSIS — R921 Mammographic calcification found on diagnostic imaging of breast: Secondary | ICD-10-CM

## 2022-07-30 DIAGNOSIS — N6012 Diffuse cystic mastopathy of left breast: Secondary | ICD-10-CM | POA: Diagnosis not present

## 2022-07-30 HISTORY — PX: BREAST BIOPSY: SHX20

## 2022-08-13 DIAGNOSIS — M25511 Pain in right shoulder: Secondary | ICD-10-CM | POA: Diagnosis not present

## 2022-08-24 DIAGNOSIS — M25511 Pain in right shoulder: Secondary | ICD-10-CM | POA: Diagnosis not present

## 2022-08-27 ENCOUNTER — Other Ambulatory Visit: Payer: Medicare Other

## 2022-08-27 DIAGNOSIS — Z0000 Encounter for general adult medical examination without abnormal findings: Secondary | ICD-10-CM

## 2022-08-27 DIAGNOSIS — E7800 Pure hypercholesterolemia, unspecified: Secondary | ICD-10-CM

## 2022-08-27 DIAGNOSIS — Z1329 Encounter for screening for other suspected endocrine disorder: Secondary | ICD-10-CM

## 2022-08-27 DIAGNOSIS — G43709 Chronic migraine without aura, not intractable, without status migrainosus: Secondary | ICD-10-CM

## 2022-09-07 DIAGNOSIS — M7541 Impingement syndrome of right shoulder: Secondary | ICD-10-CM | POA: Diagnosis not present

## 2022-09-08 DIAGNOSIS — M25511 Pain in right shoulder: Secondary | ICD-10-CM | POA: Diagnosis not present

## 2022-09-09 ENCOUNTER — Ambulatory Visit: Payer: Medicare Other | Admitting: Internal Medicine

## 2022-10-08 NOTE — Progress Notes (Signed)
Annual Wellness Visit    Patient Care Team: Margaree Mackintosh, MD as PCP - General (Internal Medicine)  Visit Date: 10/15/22   Chief Complaint  Patient presents with   Medicare Wellness   Annual Exam    Subjective:   Patient: Brandi Berry, Female    DOB: December 06, 1955, 67 y.o.   MRN: [id number]  Brandi Berry is a 67 y.o. Female who presents today for her Annual Wellness Visit. History of migraine headaches, allergies, microscopic hematuria, elevated cholesterol.  History of pure hypercholesterolemia treated with rosuvastatin 5 mg daily. CHOL elevated at 235 and LDL is elevated at 143, up from 230 and 138. She has not been taking rosuvastatin due to body aches she has had while taking it.  History of musculoskeletal pain and muscle spasms treated with meloxicam 15 mg daily, cyclobenzaprine 10 mg three times daily as needed.  History of migraine headaches and has seen Dr. Lucia Gaskins in 2018.  Mixed stress and urge urinary incontinence seen by Dr. Perley Jain in April 2023.  History of lichen sclerosis seen by dermatologist.  History of cataracts status post bilateral cataract surgery December 2022 in January 2023.  Has had some issues with lateral epicondylitis of right elbow.  Treated with anti-inflammatory medication in February 2023.  Also was given a steroid injection.   Glucose normal. Kidney, liver functions normal. Electrolytes normal. Blood proteins normal. CBC normal. TSH at 3.36.  Mammogram last completed 07/21/22. Showed indeterminate calcifications in the central inferior left breast spanning 1.0 cm, no mammographic evidence of malignancy in the right breast. Biopsy was negative for malignancy.  Colonoscopy done in 2018.   Social history: She retired from [employer] and is now working at [employer] part time. She is a widow.  She lost her husband to lung cancer in 2019 and her  adult son age 86, to a drug overdose in November 2021.  Sister resides with  her since her husband passed away.  Family history: Mother deceased.  Father with history of stroke.  Past Medical History:  Diagnosis Date   Allergy    Microscopic hematuria    Migraine    Urinary incontinence      Family History  Problem Relation Age of Onset   Stroke Father        also alocholic   Healthy Sister    Breast cancer Maternal Grandmother    Colon cancer Neg Hx    Esophageal cancer Neg Hx    Pancreatic cancer Neg Hx    Rectal cancer Neg Hx    Stomach cancer Neg Hx      Social History   Social History Narrative   Caffeine very little .  Lives at home alone.   One child.  [employer].  Works [employer].      Review of Systems  Constitutional:  Positive for malaise/fatigue. Negative for chills, fever and weight loss.  HENT:  Negative for hearing loss, sinus pain and sore throat.   Respiratory:  Negative for cough, hemoptysis and shortness of breath.   Cardiovascular:  Negative for chest pain, palpitations, leg swelling and PND.  Gastrointestinal:  Negative for abdominal pain, constipation, diarrhea, heartburn, nausea and vomiting.  Genitourinary:  Negative for dysuria, frequency and urgency.  Musculoskeletal:  Negative for back pain, myalgias and neck pain.  Skin:  Negative for itching and rash.  Neurological:  Negative for dizziness, tingling, seizures and headaches.  Endo/Heme/Allergies:  Negative for polydipsia.  Psychiatric/Behavioral:  Negative for depression. The  patient is not nervous/anxious.       Objective:   Vitals: BP 110/80   Pulse 60   Ht 5\' 4"  (1.626 m)   Wt 214 lb (97.1 kg)   SpO2 98%   BMI 36.73 kg/m   Physical Exam Vitals and nursing note reviewed.  Constitutional:      General: She is not in acute distress.    Appearance: Normal appearance. She is not ill-appearing or toxic-appearing.  HENT:     Head: Normocephalic and atraumatic.     Right Ear: Hearing, tympanic membrane, ear canal and external ear normal.     Left  Ear: Hearing, tympanic membrane, ear canal and external ear normal.     Mouth/Throat:     Pharynx: Oropharynx is clear.  Eyes:     Extraocular Movements: Extraocular movements intact.     Pupils: Pupils are equal, round, and reactive to light.  Neck:     Thyroid: No thyroid mass, thyromegaly or thyroid tenderness.     Vascular: No carotid bruit.  Cardiovascular:     Rate and Rhythm: Normal rate and regular rhythm. No extrasystoles are present.    Pulses:          Dorsalis pedis pulses are 1+ on the right side and 1+ on the left side.     Heart sounds: Normal heart sounds. No murmur heard.    No friction rub. No gallop.  Pulmonary:     Effort: Pulmonary effort is normal.     Breath sounds: Normal breath sounds. No decreased breath sounds, wheezing, rhonchi or rales.  Chest:     Chest wall: No mass.     Comments: Thickening of tissue in left breast near areola at 6 o'clock. Abdominal:     Palpations: Abdomen is soft. There is no hepatomegaly, splenomegaly or mass.     Tenderness: There is no abdominal tenderness.     Hernia: No hernia is present.  Musculoskeletal:     Cervical back: Normal range of motion.     Right lower leg: No edema.     Left lower leg: No edema.  Lymphadenopathy:     Cervical: No cervical adenopathy.     Upper Body:     Right upper body: No supraclavicular adenopathy.     Left upper body: No supraclavicular adenopathy.  Skin:    General: Skin is warm and dry.  Neurological:     General: No focal deficit present.     Mental Status: She is alert and oriented to person, place, and time. Mental status is at baseline.     Sensory: Sensation is intact.     Motor: Motor function is intact. No weakness.     Deep Tendon Reflexes: Reflexes are normal and symmetric.  Psychiatric:        Attention and Perception: Attention normal.        Mood and Affect: Mood normal.        Speech: Speech normal.        Behavior: Behavior normal.        Thought Content: Thought  content normal.        Cognition and Memory: Cognition normal.        Judgment: Judgment normal.      Most recent functional status assessment:    10/15/2022    9:49 AM  In your present state of health, do you have any difficulty performing the following activities:  Hearing? 1  Vision? 1  Difficulty concentrating or making  decisions? 1  Walking or climbing stairs? 1  Dressing or bathing? 1  Doing errands, shopping? 1  Preparing Food and eating ? N  Using the Toilet? N  In the past six months, have you accidently leaked urine? N  Do you have problems with loss of bowel control? N  Managing your Medications? N  Managing your Finances? N  Housekeeping or managing your Housekeeping? N   Most recent fall risk assessment:    10/15/2022    9:55 AM  Fall Risk   Falls in the past year? 0  Number falls in past yr: 0  Injury with Fall? 0  Risk for fall due to : No Fall Risks  Follow up Falls evaluation completed    Most recent depression screenings:    10/15/2022    9:49 AM 04/28/2021    3:01 PM  PHQ 2/9 Scores  PHQ - 2 Score 0 0   Most recent cognitive screening:    10/15/2022    9:52 AM  6CIT Screen  What Year? 0 points  What month? 0 points  What time? 0 points  Count back from 20 0 points  Months in reverse 0 points  Repeat phrase 0 points  Total Score 0 points     Results:   Studies obtained and personally reviewed by me:  Mammogram last completed 07/21/22. Showed indeterminate calcifications in the central inferior left breast spanning 1.0 cm, no mammographic evidence of malignancy in the right breast. Biopsy was negative for malignancy.  Colonoscopy done in 2018.   Labs:       Component Value Date/Time   NA 144 10/13/2022 1016   K 5.3 10/13/2022 1016   CL 108 10/13/2022 1016   CO2 28 10/13/2022 1016   GLUCOSE 86 10/13/2022 1016   BUN 15 10/13/2022 1016   CREATININE 0.71 10/13/2022 1016   CALCIUM 10.0 10/13/2022 1016   PROT 6.3 10/13/2022 1016    ALBUMIN 4.1 10/08/2016 1514   AST 14 10/13/2022 1016   ALT 8 10/13/2022 1016   ALKPHOS 98 10/08/2016 1514   BILITOT 0.7 10/13/2022 1016   GFRNONAA 81 02/20/2019 0935   GFRAA 94 02/20/2019 0935     Lab Results  Component Value Date   WBC 4.6 10/13/2022   HGB 13.3 10/13/2022   HCT 39.7 10/13/2022   MCV 93.2 10/13/2022   PLT 285 10/13/2022    Lab Results  Component Value Date   CHOL 235 (H) 10/13/2022   HDL 73 10/13/2022   LDLCALC 143 (H) 10/13/2022   TRIG 84 10/13/2022   CHOLHDL 3.2 10/13/2022    Lab Results  Component Value Date   HGBA1C 5.2 02/20/2019     Lab Results  Component Value Date   TSH 3.36 10/13/2022    Assessment & Plan:   Pure hypercholesterolemia: start rosuvastatin 5 mg three times weekly. CHOL elevated at 235 and LDL is elevated at 143, up from 230 and 138. She has not been taking rosuvastatin due to body aches she has had while taking it. Discussed coronary calcium score and she will consider this. Follow up in 3 months.  Musculoskeletal pain / muscle spasms: treated with meloxicam 15 mg daily, cyclobenzaprine 10 mg three times daily as needed.  Mammogram last completed 07/21/22. Showed indeterminate calcifications in the central inferior left breast spanning 1.0 cm, no mammographic evidence of malignancy in the right breast. Biopsy was negative for malignancy. Hx of ductal hyperplasia  Colonoscopy done in 2018.   Urinalysis normal today.  Pelvic exam deferred to gynecologist.  Mixed stress and urge urinary incontinence- seen by Urology  Macular hole right eye seen by Dr. Bur[place]i  Vaccine counseling: UTD on tetanus vaccine. Suggested flu, Covid-19 booster in the Fall.   Return in 6 months for lipid panel and pneumococcal 20 vaccine only.     Annual wellness visit done today including the all of the following: Reviewed patient's Family Medical History Reviewed and updated list of patient's medical providers Assessment of cognitive impairment  was done Assessed patient's functional ability Established a written schedule for health screening services Health Risk Assessent Completed and Reviewed  Discussed health benefits of physical activity, and encouraged her to engage in regular exercise appropriate for her age and condition.        I,Alexander Ruley,acting as a Neurosurgeon for Margaree Mackintosh, MD.,have documented all relevant documentation on the behalf of Margaree Mackintosh, MD,as directed by  Margaree Mackintosh, MD while in the presence of Margaree Mackintosh, MD.   I, Margaree Mackintosh, MD, have reviewed all documentation for this visit. The documentation on 10/31/22 for the exam, diagnosis, procedures, and orders are all accurate and complete.

## 2022-10-12 ENCOUNTER — Other Ambulatory Visit: Payer: Medicare Other

## 2022-10-12 DIAGNOSIS — Z780 Asymptomatic menopausal state: Secondary | ICD-10-CM

## 2022-10-12 DIAGNOSIS — E7800 Pure hypercholesterolemia, unspecified: Secondary | ICD-10-CM

## 2022-10-12 DIAGNOSIS — Z0000 Encounter for general adult medical examination without abnormal findings: Secondary | ICD-10-CM

## 2022-10-12 DIAGNOSIS — Z1329 Encounter for screening for other suspected endocrine disorder: Secondary | ICD-10-CM

## 2022-10-12 NOTE — Addendum Note (Signed)
Addended by: Gregery Na on: 10/12/2022 04:08 PM   Modules accepted: Orders

## 2022-10-13 ENCOUNTER — Other Ambulatory Visit: Payer: Medicare Other

## 2022-10-13 DIAGNOSIS — Z0000 Encounter for general adult medical examination without abnormal findings: Secondary | ICD-10-CM

## 2022-10-13 DIAGNOSIS — E7800 Pure hypercholesterolemia, unspecified: Secondary | ICD-10-CM

## 2022-10-13 DIAGNOSIS — Z1329 Encounter for screening for other suspected endocrine disorder: Secondary | ICD-10-CM | POA: Diagnosis not present

## 2022-10-13 DIAGNOSIS — N3946 Mixed incontinence: Secondary | ICD-10-CM

## 2022-10-13 LAB — CBC WITH DIFFERENTIAL/PLATELET
Absolute Monocytes: 451 cells/uL (ref 200–950)
Basophils Absolute: 18 cells/uL (ref 0–200)
Basophils Relative: 0.4 %
Eosinophils Absolute: 110 cells/uL (ref 15–500)
Eosinophils Relative: 2.4 %
HCT: 39.7 % (ref 35.0–45.0)
Hemoglobin: 13.3 g/dL (ref 11.7–15.5)
Lymphs Abs: 1214 cells/uL (ref 850–3900)
MCH: 31.2 pg (ref 27.0–33.0)
MCHC: 33.5 g/dL (ref 32.0–36.0)
MCV: 93.2 fL (ref 80.0–100.0)
MPV: 10.3 fL (ref 7.5–12.5)
Monocytes Relative: 9.8 %
Neutro Abs: 2806 cells/uL (ref 1500–7800)
Neutrophils Relative %: 61 %
Platelets: 285 10*3/uL (ref 140–400)
RBC: 4.26 10*6/uL (ref 3.80–5.10)
RDW: 12.3 % (ref 11.0–15.0)
Total Lymphocyte: 26.4 %
WBC: 4.6 10*3/uL (ref 3.8–10.8)

## 2022-10-15 ENCOUNTER — Ambulatory Visit (INDEPENDENT_AMBULATORY_CARE_PROVIDER_SITE_OTHER): Payer: Medicare Other | Admitting: Internal Medicine

## 2022-10-15 ENCOUNTER — Encounter: Payer: Self-pay | Admitting: Internal Medicine

## 2022-10-15 VITALS — BP 110/80 | HR 60 | Ht 64.0 in | Wt 214.0 lb

## 2022-10-15 DIAGNOSIS — Z0000 Encounter for general adult medical examination without abnormal findings: Secondary | ICD-10-CM

## 2022-10-15 DIAGNOSIS — N6019 Diffuse cystic mastopathy of unspecified breast: Secondary | ICD-10-CM

## 2022-10-15 DIAGNOSIS — Z6836 Body mass index (BMI) 36.0-36.9, adult: Secondary | ICD-10-CM | POA: Diagnosis not present

## 2022-10-15 DIAGNOSIS — N6099 Unspecified benign mammary dysplasia of unspecified breast: Secondary | ICD-10-CM | POA: Diagnosis not present

## 2022-10-15 DIAGNOSIS — N3946 Mixed incontinence: Secondary | ICD-10-CM | POA: Diagnosis not present

## 2022-10-15 DIAGNOSIS — H35341 Macular cyst, hole, or pseudohole, right eye: Secondary | ICD-10-CM

## 2022-10-15 DIAGNOSIS — E7800 Pure hypercholesterolemia, unspecified: Secondary | ICD-10-CM | POA: Diagnosis not present

## 2022-10-15 DIAGNOSIS — R350 Frequency of micturition: Secondary | ICD-10-CM | POA: Diagnosis not present

## 2022-10-15 LAB — POCT URINALYSIS DIP (CLINITEK)
Bilirubin, UA: NEGATIVE
Blood, UA: NEGATIVE
Glucose, UA: NEGATIVE mg/dL
Ketones, POC UA: NEGATIVE mg/dL
Leukocytes, UA: NEGATIVE
Nitrite, UA: NEGATIVE
POC PROTEIN,UA: NEGATIVE
Spec Grav, UA: 1.015 (ref 1.010–1.025)
Urobilinogen, UA: 0.2 E.U./dL
pH, UA: 6 (ref 5.0–8.0)

## 2022-10-16 DIAGNOSIS — L80 Vitiligo: Secondary | ICD-10-CM | POA: Diagnosis not present

## 2022-10-16 DIAGNOSIS — L900 Lichen sclerosus et atrophicus: Secondary | ICD-10-CM | POA: Diagnosis not present

## 2022-10-23 ENCOUNTER — Other Ambulatory Visit: Payer: Self-pay | Admitting: Internal Medicine

## 2022-10-26 DIAGNOSIS — M25511 Pain in right shoulder: Secondary | ICD-10-CM | POA: Diagnosis not present

## 2022-10-31 NOTE — Patient Instructions (Signed)
It was a pleasure to see you today.  Please start rosuvastatin 5 mg 3 times weekly and follow-up in 3 months due to elevated cholesterol.  Consider coronary calcium scoring to determine if you have heart disease.  Continue meloxicam 15 mg daily as needed and Flexeril as needed.  Try to take meloxicam sparingly due to risk of developing chronic kidney disease with chronic anti-inflammatory medications.  Continue annual eye exams by Dr. Burundi.  Suggest flu vaccine and COVID-19 booster this Fall.  Tetanus vaccine is up-to-date.

## 2023-01-08 ENCOUNTER — Telehealth: Payer: Self-pay | Admitting: Internal Medicine

## 2023-01-08 NOTE — Telephone Encounter (Signed)
Left a voice mail that physical needs to be rescheduled. Dr Lenord Fellers is going to be out of office this day. 10/26/2023

## 2023-01-12 DIAGNOSIS — R350 Frequency of micturition: Secondary | ICD-10-CM | POA: Diagnosis not present

## 2023-01-12 NOTE — Telephone Encounter (Signed)
Called and rescheduled.

## 2023-01-14 NOTE — Progress Notes (Signed)
Patient Care Team: Margaree Mackintosh, MD as PCP - General (Internal Medicine)  Visit Date: 01/21/23  Subjective:    Patient ID: Brandi Berry , Female   DOB: Dec 24, 1955, 67 y.o.    MRN: [id number]   67 y.o. Female presents today for a 3-month follow-up.  History of pure hypercholesterolemia treated with rosuvastatin 5 mg daily.   History of musculoskeletal pain and muscle spasms treated with meloxicam 15 mg daily, cyclobenzaprine 10 mg three times daily as needed. Reports meloxicam is not relieving symptoms and would like to try something else.  Past Medical History:  Diagnosis Date  . Allergy   . Microscopic hematuria   . Migraine   . Urinary incontinence      Family History  Problem Relation Age of Onset  . Stroke Father        also alocholic  . Healthy Sister   . Breast cancer Maternal Grandmother   . Colon cancer Neg Hx   . Esophageal cancer Neg Hx   . Pancreatic cancer Neg Hx   . Rectal cancer Neg Hx   . Stomach cancer Neg Hx     Social Hx: employed at [employer]. Retired from [employer]. Resides alone. Widow.  Had one son who is deceased.     Review of Systems  Constitutional:  Negative for fever and malaise/fatigue.  HENT:  Negative for congestion.   Eyes:  Negative for blurred vision.  Respiratory:  Negative for cough and shortness of breath.   Cardiovascular:  Negative for chest pain, palpitations and leg swelling.  Gastrointestinal:  Negative for vomiting.  Musculoskeletal:  Positive for joint pain. Negative for back pain.  Skin:  Negative for rash.  Neurological:  Negative for loss of consciousness and headaches.        Objective:   Vitals: BP 120/80   Pulse 74   Ht 5\' 4"  (1.626 m)   Wt 218 lb (98.9 kg)   SpO2 96%   BMI 37.42 kg/m    Physical Exam Vitals and nursing note reviewed.  Constitutional:      General: She is not in acute distress.    Appearance: Normal appearance. She is not toxic-appearing.  HENT:     Head:  Normocephalic and atraumatic.  Pulmonary:     Effort: Pulmonary effort is normal.  Skin:    General: Skin is warm and dry.  Neurological:     Mental Status: She is alert and oriented to person, place, and time. Mental status is at baseline.  Psychiatric:        Mood and Affect: Mood normal.        Behavior: Behavior normal.        Thought Content: Thought content normal.        Judgment: Judgment normal.      Results:   Studies obtained and personally reviewed by me:   Labs:       Component Value Date/Time   NA 144 10/13/2022 1016   K 5.3 10/13/2022 1016   CL 108 10/13/2022 1016   CO2 28 10/13/2022 1016   GLUCOSE 86 10/13/2022 1016   BUN 15 10/13/2022 1016   CREATININE 0.71 10/13/2022 1016   CALCIUM 10.0 10/13/2022 1016   PROT 6.3 10/13/2022 1016   ALBUMIN 4.1 10/08/2016 1514   AST 14 10/13/2022 1016   ALT 8 10/13/2022 1016   ALKPHOS 98 10/08/2016 1514   BILITOT 0.7 10/13/2022 1016   GFRNONAA 81 02/20/2019 0935   GFRAA 94 02/20/2019  0935     Lab Results  Component Value Date   WBC 4.6 10/13/2022   HGB 13.3 10/13/2022   HCT 39.7 10/13/2022   MCV 93.2 10/13/2022   PLT 285 10/13/2022    Lab Results  Component Value Date   CHOL 235 (H) 10/13/2022   HDL 73 10/13/2022   LDLCALC 143 (H) 10/13/2022   TRIG 84 10/13/2022   CHOLHDL 3.2 10/13/2022    Lab Results  Component Value Date   HGBA1C 5.2 02/20/2019     Lab Results  Component Value Date   TSH 3.36 10/13/2022      Assessment & Plan:   Musculoskeletal pain: prescribed Norco 10-325 mg every 8 hours as needed.  Muscle spasms: refilled cyclobenzaprine.  Hyperlipidemia: continue current regimen.  Vaccine counseling: administered flu booster.  Return for lipid, liver panels. Make sure she is fasting.    I,Alexander Ruley,acting as a Neurosurgeon for Margaree Mackintosh, MD.,have documented all relevant documentation on the behalf of Margaree Mackintosh, MD,as directed by  Margaree Mackintosh, MD while in the presence  of Margaree Mackintosh, MD.   ***

## 2023-01-19 ENCOUNTER — Other Ambulatory Visit: Payer: Medicare Other

## 2023-01-21 ENCOUNTER — Encounter: Payer: Self-pay | Admitting: Internal Medicine

## 2023-01-21 ENCOUNTER — Ambulatory Visit (INDEPENDENT_AMBULATORY_CARE_PROVIDER_SITE_OTHER): Payer: Medicare Other | Admitting: Internal Medicine

## 2023-01-21 VITALS — BP 120/80 | HR 74 | Ht 64.0 in | Wt 218.0 lb

## 2023-01-21 DIAGNOSIS — M7918 Myalgia, other site: Secondary | ICD-10-CM

## 2023-01-21 DIAGNOSIS — E7800 Pure hypercholesterolemia, unspecified: Secondary | ICD-10-CM | POA: Diagnosis not present

## 2023-01-21 MED ORDER — CYCLOBENZAPRINE HCL 10 MG PO TABS
10.0000 mg | ORAL_TABLET | Freq: Three times a day (TID) | ORAL | 3 refills | Status: DC | PRN
Start: 2023-01-21 — End: 2023-05-04

## 2023-01-21 MED ORDER — HYDROCODONE-ACETAMINOPHEN 10-325 MG PO TABS: 1.0000 | ORAL_TABLET | Freq: Three times a day (TID) | ORAL | 0 refills | Status: AC | PRN

## 2023-01-30 IMAGING — MG MM DIGITAL SCREENING BILAT W/ TOMO AND CAD
8 series · 8 of 24 positions shown · non-contrast
Comparison: Previous exam(s).

CLINICAL DATA: Screening.

EXAM:
DIGITAL SCREENING BILATERAL MAMMOGRAM WITH TOMOSYNTHESIS AND CAD
TECHNIQUE: Bilateral screening digital craniocaudal and mediolateral oblique
mammograms were obtained. Bilateral screening digital breast
tomosynthesis was performed. The images were evaluated with
computer-aided detection.

[R MLO synth-2D]
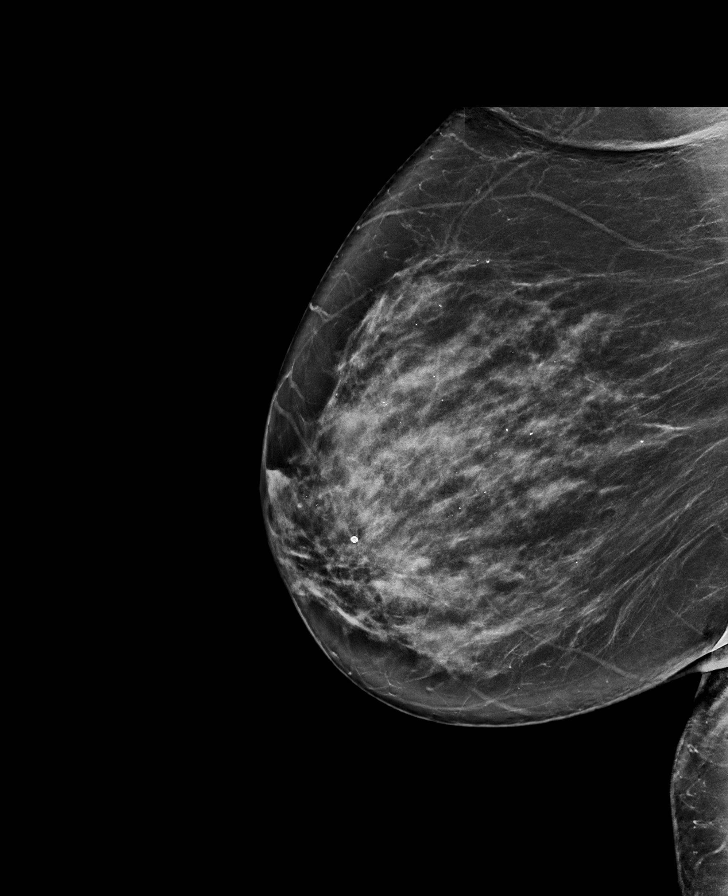

[L MLO synth-2D]
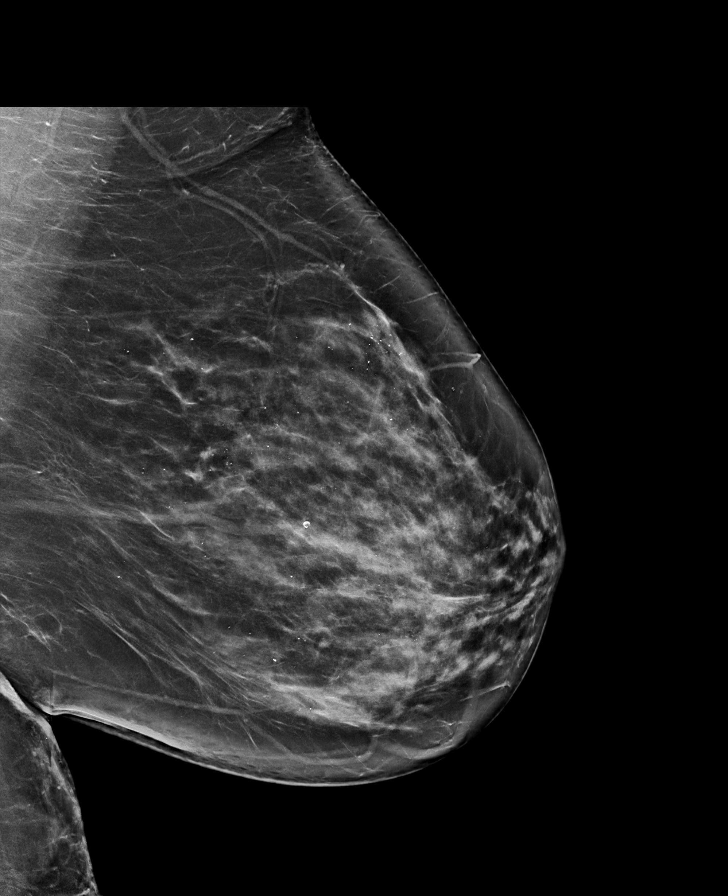

[L CC synth-2D]
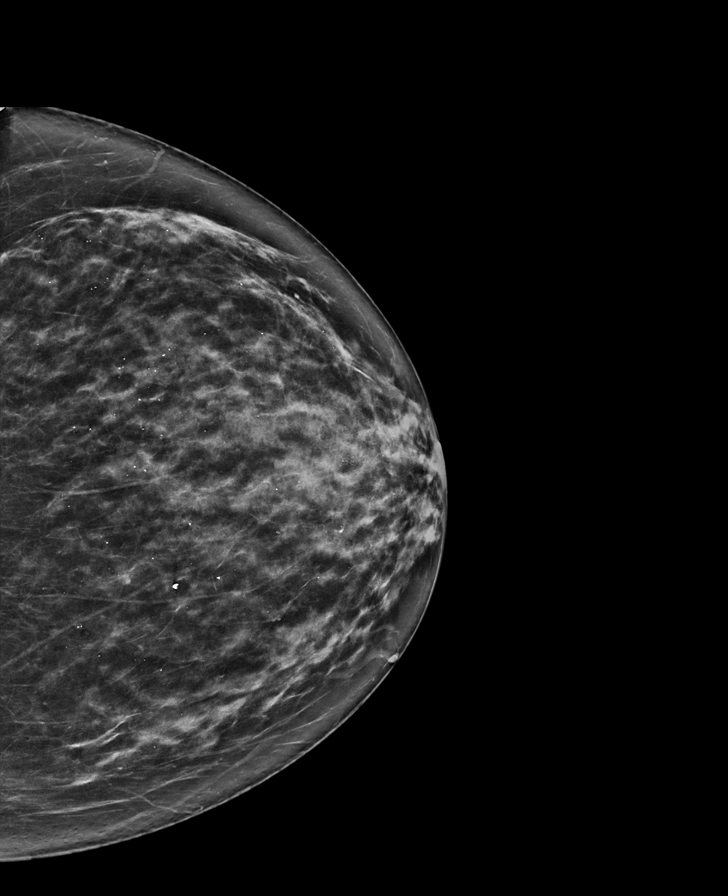

[R CC synth-2D]
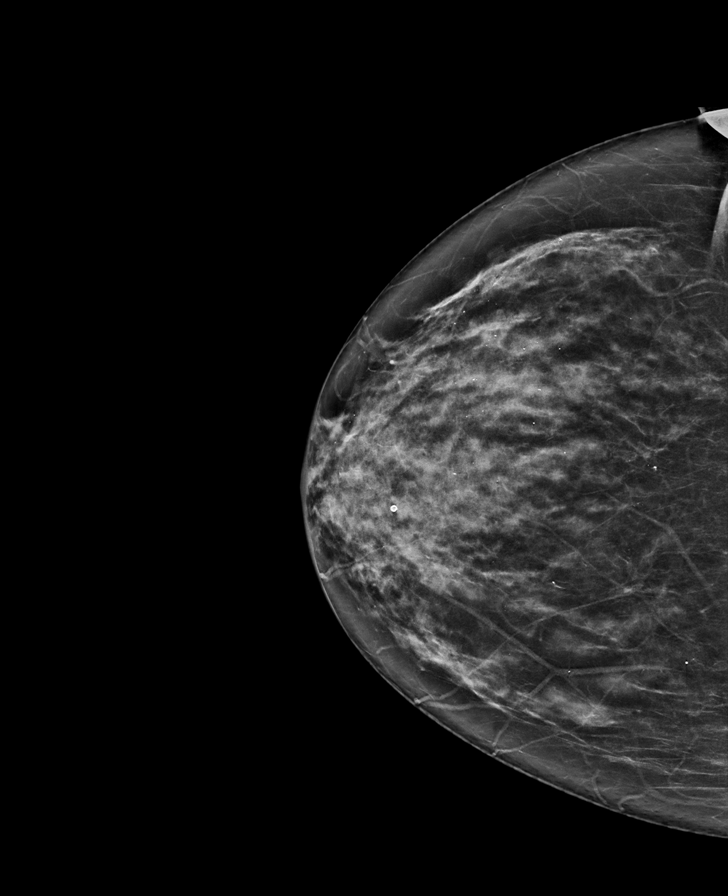

[L CC tomo · tomo slice 37/74.0]
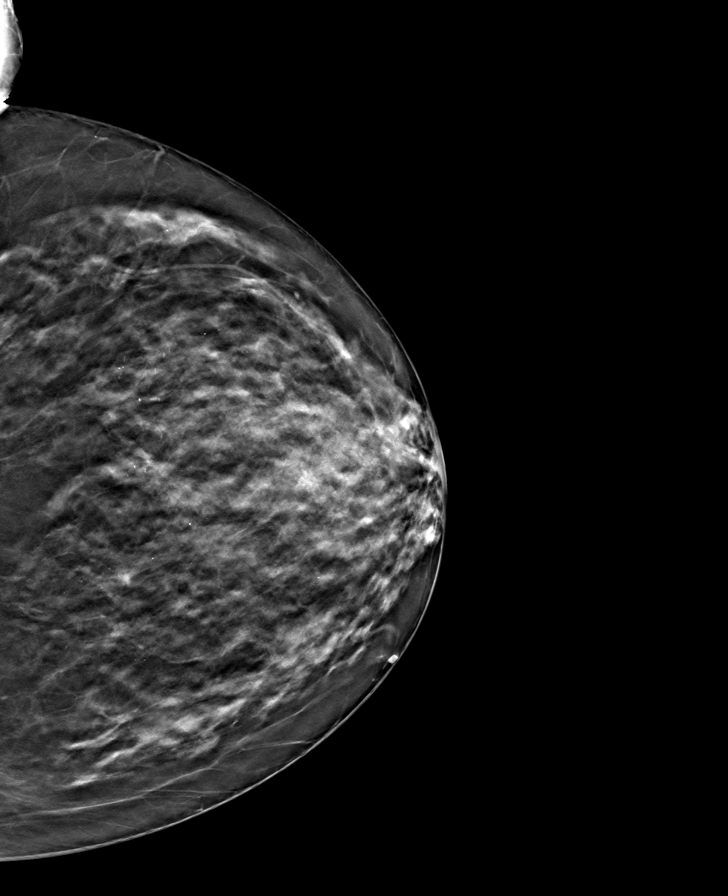

[R CC tomo · tomo slice 38/75.0]
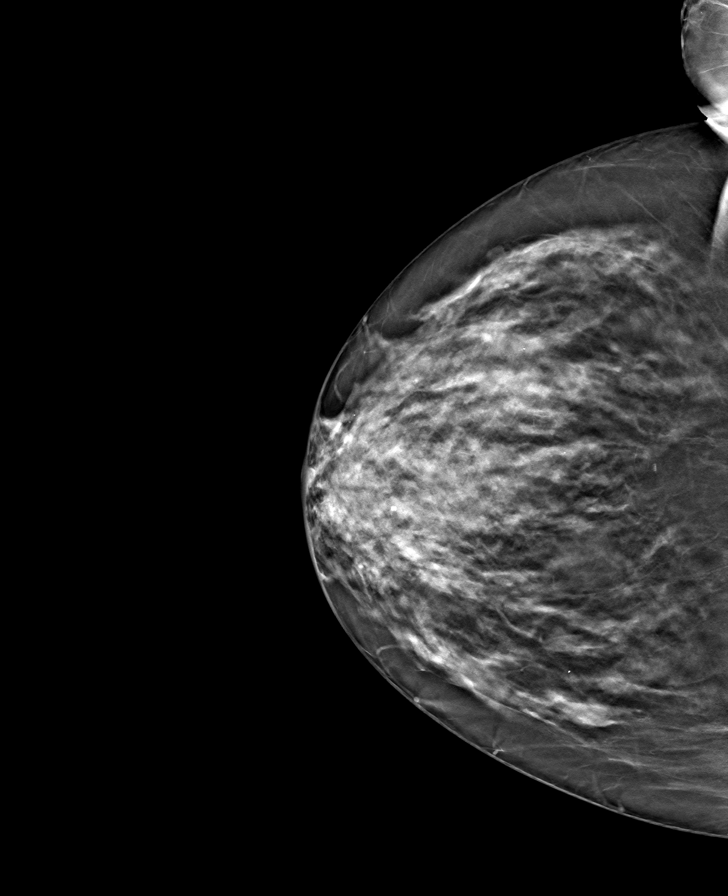

[L MLO tomo · tomo slice 46/91.0]
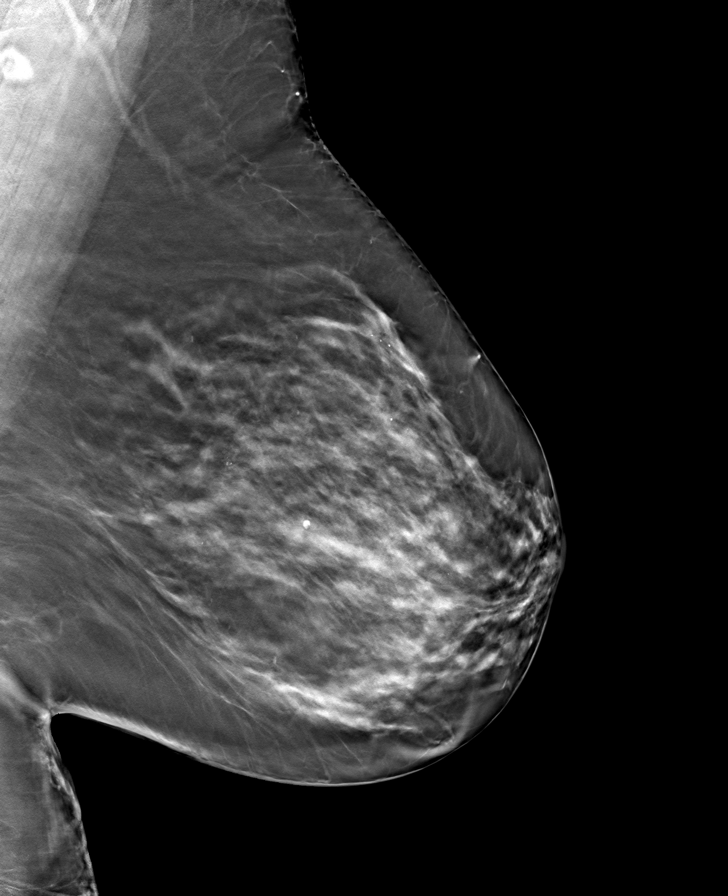

[R MLO tomo · tomo slice 43/86.0]
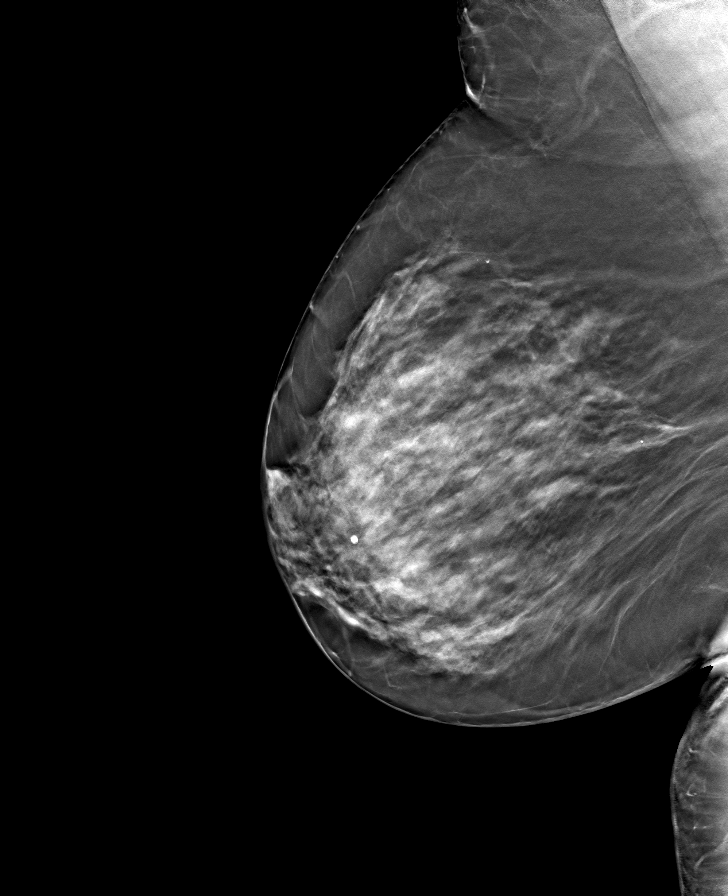

[8 of 24 positions shown; findings below may reference images not displayed]

ACR Breast Density Category c: The breast tissue is heterogeneously
dense, which may obscure small masses.
FINDINGS: In the left breast, calcifications warrant further evaluation. In
the right breast, no findings suspicious for malignancy.
IMPRESSION: Further evaluation is suggested for calcifications in the left
breast.

RECOMMENDATION:
Diagnostic mammogram of the left breast. (Code:AS-B-FFA)

The patient will be contacted regarding the findings, and additional
imaging will be scheduled.

BI-RADS CATEGORY  0: Incomplete. Need additional imaging evaluation
and/or prior mammograms for comparison.

## 2023-01-30 NOTE — Patient Instructions (Signed)
Patient is having considerable musculoskeletal pain and have prescribed Norco 10/325 to take sparingly.  Can only prescribe a 5-day course (number 15 tablets).  Have refilled generic Flexeril.  Does not want to go to physical therapy.  Regarding hyperlipidemia, she is not fasting.  We could not check her labs today.  She should be taking rosuvastatin 5 mg daily and watching her diet.  We could consider coronary calcium scoring (CT (to see what degree of heart disease she has.  Given flu vaccine today.  We have made her a lab appointment for mid December to check lipid panel liver functions on rosuvastatin.

## 2023-02-18 ENCOUNTER — Other Ambulatory Visit: Payer: Medicare Other

## 2023-02-18 DIAGNOSIS — E7800 Pure hypercholesterolemia, unspecified: Secondary | ICD-10-CM

## 2023-02-19 LAB — LIPID PANEL
Cholesterol: 218 mg/dL — ABNORMAL HIGH
HDL: 76 mg/dL
LDL Cholesterol (Calc): 125 mg/dL — ABNORMAL HIGH
Non-HDL Cholesterol (Calc): 142 mg/dL — ABNORMAL HIGH
Total CHOL/HDL Ratio: 2.9 (calc)
Triglycerides: 71 mg/dL

## 2023-02-19 LAB — HEPATIC FUNCTION PANEL
AG Ratio: 1.7 (calc) (ref 1.0–2.5)
ALT: 11 U/L (ref 6–29)
AST: 15 U/L (ref 10–35)
Albumin: 4 g/dL (ref 3.6–5.1)
Alkaline phosphatase (APISO): 88 U/L (ref 37–153)
Bilirubin, Direct: 0.1 mg/dL (ref 0.0–0.2)
Globulin: 2.3 g/dL (ref 1.9–3.7)
Indirect Bilirubin: 0.3 mg/dL (ref 0.2–1.2)
Total Bilirubin: 0.4 mg/dL (ref 0.2–1.2)
Total Protein: 6.3 g/dL (ref 6.1–8.1)

## 2023-02-26 ENCOUNTER — Encounter: Payer: Self-pay | Admitting: Internal Medicine

## 2023-02-26 ENCOUNTER — Ambulatory Visit (INDEPENDENT_AMBULATORY_CARE_PROVIDER_SITE_OTHER): Payer: Medicare Other | Admitting: Internal Medicine

## 2023-02-26 VITALS — BP 110/80 | HR 64 | Ht 64.0 in

## 2023-02-26 DIAGNOSIS — E7800 Pure hypercholesterolemia, unspecified: Secondary | ICD-10-CM

## 2023-02-26 MED ORDER — ROSUVASTATIN CALCIUM 5 MG PO TABS: 5.0000 mg | ORAL_TABLET | Freq: Every day | ORAL | 3 refills | Status: DC

## 2023-02-26 NOTE — Progress Notes (Shared)
Patient Care Team: Margaree Mackintosh, MD as PCP - General (Internal Medicine)  Visit Date: 02/26/23  Subjective:    Patient ID: Brandi Berry , Female   DOB: 12/11/1955, 67 y.o.    MRN: [id number]   66 y.o. Female presents today for lipid, liver recheck.   History of hyperlipidemia treated with rosuvastatin 5 mg daily. She is taking this three times weekly on average. LDL elevated at 125 on 02/18/23, down from 143 four months ago. Liver functions normal. Mother with history of heart murmur.   Past Medical History:  Diagnosis Date   Allergy    Microscopic hematuria    Migraine    Urinary incontinence      Family History  Problem Relation Age of Onset   Stroke Father        also alocholic   Healthy Sister    Breast cancer Maternal Grandmother    Colon cancer Neg Hx    Esophageal cancer Neg Hx    Pancreatic cancer Neg Hx    Rectal cancer Neg Hx    Stomach cancer Neg Hx     Social History   Social History Narrative   Caffeine very little .  Lives at home alone.   One child.  [employer].  Works [employer].       Review of Systems  Constitutional:  Negative for fever and malaise/fatigue.  HENT:  Negative for congestion.   Eyes:  Negative for blurred vision.  Respiratory:  Negative for cough and shortness of breath.   Cardiovascular:  Negative for chest pain, palpitations and leg swelling.  Gastrointestinal:  Negative for vomiting.  Musculoskeletal:  Negative for back pain.  Skin:  Negative for rash.  Neurological:  Negative for loss of consciousness and headaches.        Objective:   Vitals: BP 110/80   Pulse 64   Ht 5\' 4"  (1.626 m)   SpO2 97%   BMI 37.42 kg/m    Physical Exam Vitals and nursing note reviewed.  Constitutional:      General: She is not in acute distress.    Appearance: Normal appearance. She is not toxic-appearing.  HENT:     Head: Normocephalic and atraumatic.  Pulmonary:     Effort: Pulmonary effort is normal.   Skin:    General: Skin is warm and dry.  Neurological:     Mental Status: She is alert and oriented to person, place, and time. Mental status is at baseline.  Psychiatric:        Mood and Affect: Mood normal.        Behavior: Behavior normal.        Thought Content: Thought content normal.        Judgment: Judgment normal.       Results:   Studies obtained and personally reviewed by me:   Labs:       Component Value Date/Time   NA 144 10/13/2022 1016   K 5.3 10/13/2022 1016   CL 108 10/13/2022 1016   CO2 28 10/13/2022 1016   GLUCOSE 86 10/13/2022 1016   BUN 15 10/13/2022 1016   CREATININE 0.71 10/13/2022 1016   CALCIUM 10.0 10/13/2022 1016   PROT 6.3 02/18/2023 0941   ALBUMIN 4.1 10/08/2016 1514   AST 15 02/18/2023 0941   ALT 11 02/18/2023 0941   ALKPHOS 98 10/08/2016 1514   BILITOT 0.4 02/18/2023 0941   GFRNONAA 81 02/20/2019 0935   GFRAA 94 02/20/2019 0935  Lab Results  Component Value Date   WBC 4.6 10/13/2022   HGB 13.3 10/13/2022   HCT 39.7 10/13/2022   MCV 93.2 10/13/2022   PLT 285 10/13/2022    Lab Results  Component Value Date   CHOL 218 (H) 02/18/2023   HDL 76 02/18/2023   LDLCALC 125 (H) 02/18/2023   TRIG 71 02/18/2023   CHOLHDL 2.9 02/18/2023    Lab Results  Component Value Date   HGBA1C 5.2 02/20/2019     Lab Results  Component Value Date   TSH 3.36 10/13/2022      Assessment & Plan:   Hyperlipidemia: refilled rosuvastatin 5 mg daily. She will be more careful to take this daily as directed. Ordered coronary calcium score and given information to schedule.    I,Alexander Ruley,acting as a Neurosurgeon for Margaree Mackintosh, MD.,have documented all relevant documentation on the behalf of Margaree Mackintosh, MD,as directed by  Margaree Mackintosh, MD while in the presence of Margaree Mackintosh, MD.  I, Margaree Mackintosh, MD, have reviewed all documentation for this visit. The documentation on 02/27/23 for the exam, diagnosis, procedures, and orders are all  accurate and complete.

## 2023-02-26 NOTE — Patient Instructions (Addendum)
Patient does not want to increase dose of lipid lowering medication. Agrees to coronary calcium scoring. Order placed.  Take Rosuvastatin daily. Has CPE here in August.

## 2023-03-22 DIAGNOSIS — H40013 Open angle with borderline findings, low risk, bilateral: Secondary | ICD-10-CM | POA: Diagnosis not present

## 2023-04-01 DIAGNOSIS — Z0289 Encounter for other administrative examinations: Secondary | ICD-10-CM

## 2023-04-05 ENCOUNTER — Encounter (INDEPENDENT_AMBULATORY_CARE_PROVIDER_SITE_OTHER): Payer: Self-pay | Admitting: Family Medicine

## 2023-04-05 ENCOUNTER — Ambulatory Visit (INDEPENDENT_AMBULATORY_CARE_PROVIDER_SITE_OTHER): Payer: Medicare Other | Admitting: Family Medicine

## 2023-04-05 VITALS — BP 131/84 | HR 72 | Temp 98.4°F | Ht 63.0 in | Wt 214.0 lb

## 2023-04-05 DIAGNOSIS — E669 Obesity, unspecified: Secondary | ICD-10-CM

## 2023-04-05 DIAGNOSIS — E785 Hyperlipidemia, unspecified: Secondary | ICD-10-CM

## 2023-04-05 DIAGNOSIS — R030 Elevated blood-pressure reading, without diagnosis of hypertension: Secondary | ICD-10-CM

## 2023-04-05 DIAGNOSIS — Z6837 Body mass index (BMI) 37.0-37.9, adult: Secondary | ICD-10-CM | POA: Diagnosis not present

## 2023-04-05 DIAGNOSIS — E7800 Pure hypercholesterolemia, unspecified: Secondary | ICD-10-CM

## 2023-04-05 DIAGNOSIS — I10 Essential (primary) hypertension: Secondary | ICD-10-CM

## 2023-04-05 DIAGNOSIS — E66812 Obesity, class 2: Secondary | ICD-10-CM | POA: Insufficient documentation

## 2023-04-05 NOTE — Progress Notes (Signed)
[facility]  Office: [phone]  /  Fax: [phone]  WEIGHT SUMMARY AND BIOMETRICS  Anthropometric Measurements Height: 5\' 3"  (1.6 m) Weight: 214 lb (97.1 kg) BMI (Calculated): 37.92 Peak Weight: 214 lb   Body Composition  Body Fat %: 50.1 % Fat Mass (lbs): 107.6 lbs Muscle Mass (lbs): 101.8 lbs Total Body Water (lbs): 77 lbs Visceral Fat Rating : 16   Other Clinical Data Fasting: no Labs: no Comments: inf. session    Chief Complaint: OBESITY   History of Present Illness   The patient presents with obesity and related conditions, including hyperlipidemia. She was referred by Dr. Lenord Fellers for evaluation of obesity and related conditions.  Her current weight is 214 pounds, with a BMI of 38 and a visceral fat rating of 16. She has attempted various weight loss methods without success and is seeking assistance. Her body fat percentage was not specified.  She is currently taking medication for hyperlipidemia, which she dislikes but takes regularly. The medication causes her bones to ache, and she takes CoQ10 supplements to alleviate this side effect, though she is unsure of the dosage.  She reports elevated blood pressure readings today, with an initial measurement of 149/84 mmHg and a repeat measurement of 131/84 mmHg. She does not have a history of hypertension.          PHYSICAL EXAM:  Blood pressure 131/84, pulse 72, temperature 98.4 F (36.9 C), height 5\' 3"  (1.6 m), weight 214 lb (97.1 kg), SpO2 99%. Body mass index is 37.91 kg/m.  DIAGNOSTIC DATA REVIEWED:  BMET    Component Value Date/Time   NA 144 10/13/2022 1016   K 5.3 10/13/2022 1016   CL 108 10/13/2022 1016   CO2 28 10/13/2022 1016   GLUCOSE 86 10/13/2022 1016   BUN 15 10/13/2022 1016   CREATININE 0.71 10/13/2022 1016   CALCIUM 10.0 10/13/2022 1016   GFRNONAA 81 02/20/2019 0935   GFRAA 94 02/20/2019 0935   Lab Results  Component Value Date   HGBA1C 5.2 02/20/2019   No results found for:  "INSULIN" Lab Results  Component Value Date   TSH 3.36 10/13/2022   CBC    Component Value Date/Time   WBC 4.6 10/13/2022 1016   RBC 4.26 10/13/2022 1016   HGB 13.3 10/13/2022 1016   HCT 39.7 10/13/2022 1016   PLT 285 10/13/2022 1016   MCV 93.2 10/13/2022 1016   MCH 31.2 10/13/2022 1016   MCHC 33.5 10/13/2022 1016   RDW 12.3 10/13/2022 1016   Iron Studies No results found for: "IRON", "TIBC", "FERRITIN", "IRONPCTSAT" Lipid Panel     Component Value Date/Time   CHOL 218 (H) 02/18/2023 0941   TRIG 71 02/18/2023 0941   HDL 76 02/18/2023 0941   CHOLHDL 2.9 02/18/2023 0941   VLDL 14 09/30/2016 0900   LDLCALC 125 (H) 02/18/2023 0941   Hepatic Function Panel     Component Value Date/Time   PROT 6.3 02/18/2023 0941   ALBUMIN 4.1 10/08/2016 1514   AST 15 02/18/2023 0941   ALT 11 02/18/2023 0941   ALKPHOS 98 10/08/2016 1514   BILITOT 0.4 02/18/2023 0941   BILIDIR 0.1 02/18/2023 0941   IBILI 0.3 02/18/2023 0941      Component Value Date/Time   TSH 3.36 10/13/2022 1016   Nutritional Lab Results  Component Value Date   VD25OH 31 10/18/2017   VD25OH 24 (L) 09/30/2016     Assessment and Plan    Obesity Obesity with a BMI of 38. Discussed genetic  and physiological mechanisms affecting weight loss, including metabolism changes, hunger signals, and fat storage efficiency. Emphasized personalized diet and exercise plans, potential role of medications, and comprehensive treatment approach. Explained GLP-1 drugs, phentermine, and metformin and others including mechanisms, benefits, and side effects. - Schedule workup visit with fasting requirements (8 hours, water only, no caffeine, no nicotine, no exercise) - Provide new patient paperwork - Schedule follow-up visits every two weeks for the first three months, then monthly - Discuss potential use of medications after initial workup results  Hyperlipidemia Hyperlipidemia managed with statin therapy. Reports muscle aches  managed with CoQ10 supplementation (300-400 mg/day). - Review and adjust CoQ10 dosage to 300-400 mg/day if necessary  Elevated Blood Pressure Elevated blood pressure noted (initial 149/84, repeat 131/84). Monitoring required to determine persistence. - Monitor blood pressure at future visits -work on diet/exercise/weight loss to prevent HTN  General Health Maintenance Discussed importance of proper nutrition, exercise, and regular check-ups. Emphasized personalized dietary plans and potential role of medications in managing obesity and related conditions. - Provide nutritional counseling - Develop personalized exercise plan - Continue regular follow-up visits to monitor progress and adjust treatment as necessary  Follow-up - Schedule follow-up visits every two weeks for the first three months, then monthly - Ensure completion of new patient paperwork before the next visit - Provide MyChart access for additional questions or concerns.         I have personally spent 45 minutes total time today in preparation, patient care, and documentation for this visit, including the following: review of clinical lab tests; review of medical tests/procedures/services.    She was informed of the importance of frequent follow up visits to maximize her success with intensive lifestyle modifications for her multiple health conditions.    Quillian Quince, MD

## 2023-04-20 DIAGNOSIS — L80 Vitiligo: Secondary | ICD-10-CM | POA: Diagnosis not present

## 2023-04-20 DIAGNOSIS — L900 Lichen sclerosus et atrophicus: Secondary | ICD-10-CM | POA: Diagnosis not present

## 2023-04-27 ENCOUNTER — Telehealth: Payer: Self-pay

## 2023-04-27 NOTE — Telephone Encounter (Signed)
 Spoke with patient she said "she is already taking her crestor with coQ10 and her body still hurting all day long"

## 2023-04-27 NOTE — Telephone Encounter (Signed)
 Copied from CRM [id number]. Topic: Clinical - Medication Question >> Apr 27, 2023 10:10 AM Gery Pray wrote: Reason for CRM: Patient body is aching after taking cholesterol medicine rosuvastatin (CRESTOR) 5 MG tablet. Patient would like a callback from the provider to see if there is another medication she can take that doesn't have that side effect. Patient's callback number is [phone].

## 2023-05-04 ENCOUNTER — Ambulatory Visit (INDEPENDENT_AMBULATORY_CARE_PROVIDER_SITE_OTHER): Payer: Medicare Other | Admitting: Family Medicine

## 2023-05-04 ENCOUNTER — Encounter (INDEPENDENT_AMBULATORY_CARE_PROVIDER_SITE_OTHER): Payer: Self-pay | Admitting: Family Medicine

## 2023-05-04 VITALS — BP 153/77 | HR 61 | Temp 98.1°F | Ht 63.0 in | Wt 214.0 lb

## 2023-05-04 DIAGNOSIS — E66812 Obesity, class 2: Secondary | ICD-10-CM

## 2023-05-04 DIAGNOSIS — E559 Vitamin D deficiency, unspecified: Secondary | ICD-10-CM | POA: Diagnosis not present

## 2023-05-04 DIAGNOSIS — E7800 Pure hypercholesterolemia, unspecified: Secondary | ICD-10-CM

## 2023-05-04 DIAGNOSIS — Z1331 Encounter for screening for depression: Secondary | ICD-10-CM

## 2023-05-04 DIAGNOSIS — R0602 Shortness of breath: Secondary | ICD-10-CM | POA: Diagnosis not present

## 2023-05-04 DIAGNOSIS — Z6837 Body mass index (BMI) 37.0-37.9, adult: Secondary | ICD-10-CM

## 2023-05-04 DIAGNOSIS — R739 Hyperglycemia, unspecified: Secondary | ICD-10-CM

## 2023-05-04 DIAGNOSIS — R5383 Other fatigue: Secondary | ICD-10-CM

## 2023-05-04 DIAGNOSIS — N3281 Overactive bladder: Secondary | ICD-10-CM | POA: Diagnosis not present

## 2023-05-04 MED ORDER — ATORVASTATIN CALCIUM 10 MG PO TABS
10.0000 mg | ORAL_TABLET | Freq: Every day | ORAL | 0 refills | Status: DC
Start: 2023-05-04 — End: 2023-06-22

## 2023-05-04 NOTE — Assessment & Plan Note (Signed)
 Last Vitamin D level in 2019 and was half of goal level of 60.  She is on 50mg  daily of Vitamin D.  Will draw vitamin D level today.

## 2023-05-04 NOTE — Progress Notes (Signed)
 Chief Complaint:  Obesity   Subjective:  Brandi Berry (MR# [id number]) is a 68 y.o. female who presents for evaluation and treatment of obesity and related comorbidities.   Brandi Berry is currently in the action stage of change and ready to dedicate time achieving and maintaining a healthier weight. Brandi Berry is interested in becoming our patient and working on intensive lifestyle modifications including (but not limited to) diet and exercise for weight loss.  Brandi Berry has been struggling with her weight. She has been unsuccessful in either losing weight, maintaining weight loss, or reaching her healthy weight goal.  Brandi Berry sensitive- can tolerate yogurt but cannot tolerate milk. She is retired and works part time at [employer].  She is widowed and lives with her sister Brandi Berry.  Husband died 5 years ago.  She mentions her family of supportive of her and she will be changing how she eats with patient.  Desired weight is 175lbs.  Last time she was that weight was 10-15 years ago.  Has been a steady weight gain since that time.  Previously tried [employer].  Doesn't cook much anymore due to being a widow.    Food Recall: Tea in the am- sugar free vanilla flavor with a small amount of sugar.  Toast and 2 boiled eggs (butter on toast). May make the eggs into egg salad.  Feels fairly full. 2-3 hours later will have a few crackers and a piece of cheese or slices of orange or apples.  May or may not have another snack before dinner and could be chips.  Dinner could be the chips or crackers.    Indirect Calorimeter completed today shows a RMR: 1310 .Her calculated basal metabolic rate is 0347 thus her basal metabolic rate is worse than expected.  Other Fatigue Brandi Berry denies daytime somnolence and denies waking up still tired. Brandi Berry generally gets 6 hours of sleep per night, and states that she has generally restful sleep. Snoring is present. Apneic episodes are not present. Epworth Sleepiness Score is 4.    Shortness of Breath Brandi Berry notes increasing shortness of breath with exercising and seems to be worsening over time with weight gain. She notes getting out of breath sooner with activity than she used to. This has not gotten worse recently. Brandi Berry denies shortness of breath at rest or orthopnea.  Depression Screen Brandi Berry's Food and Mood (modified PHQ-9) score was 7.     05/04/2023    9:14 AM  Depression screen PHQ 2/9  Decreased Interest 0  Down, Depressed, Hopeless 0  PHQ - 2 Score 0  Altered sleeping 0  Tired, decreased energy 0  Change in appetite 0  Feeling bad or failure about yourself  0  Trouble concentrating 0  Moving slowly or fidgety/restless 0  Suicidal thoughts 0  PHQ-9 Score 0     Objective:  Vitals Temp: 98.1 F (36.7 C) BP: (!) 153/77 Pulse Rate: 61 SpO2: 99 %   Anthropometric Measurements Height: 5\' 3"  (1.6 m) Weight: 214 lb (97.1 kg) BMI (Calculated): 37.92 Starting Weight: 214 lb Peak Weight: 214 lb Waist Measurement : 44 inches   Body Composition  Body Fat %: 48.9 % Fat Mass (lbs): 104.8 lbs Muscle Mass (lbs): 103.8 lbs Total Body Water (lbs): 73.4 lbs Visceral Fat Rating : 16   Other Clinical Data RMR: 1310 Fasting: yes Labs: yes Today's Visit #: 1 Starting Date: 05/04/23    EKG: Normal sinus rhythm, rate 56.  General: Cooperative, alert, well developed, in no acute distress. HEENT: Conjunctivae  and lids unremarkable. Cardiovascular: Regular rhythm.  Lungs: Normal work of breathing. Neurologic: No focal deficits.   Lab Results  Component Value Date   CREATININE 0.71 10/13/2022   BUN 15 10/13/2022   NA 144 10/13/2022   K 5.3 10/13/2022   CL 108 10/13/2022   CO2 28 10/13/2022   Lab Results  Component Value Date   ALT 11 02/18/2023   AST 15 02/18/2023   ALKPHOS 98 10/08/2016   BILITOT 0.4 02/18/2023   Lab Results  Component Value Date   HGBA1C 5.2 02/20/2019   No results found for: "INSULIN" Lab Results   Component Value Date   TSH 3.36 10/13/2022   Lab Results  Component Value Date   CHOL 218 (H) 02/18/2023   HDL 76 02/18/2023   LDLCALC 125 (H) 02/18/2023   TRIG 71 02/18/2023   CHOLHDL 2.9 02/18/2023   Lab Results  Component Value Date   WBC 4.6 10/13/2022   HGB 13.3 10/13/2022   HCT 39.7 10/13/2022   MCV 93.2 10/13/2022   PLT 285 10/13/2022   No results found for: "IRON", "TIBC", "FERRITIN"  Assessment and Plan:   Other Fatigue  Brandi Berry does feel that her weight is causing her energy to be lower than it should be. Fatigue may be related to obesity, depression or many other causes. Labs will be ordered, and in the meanwhile, Brandi Berry will focus on self care including making healthy food choices, increasing physical activity and focusing on stress reduction.  Shortness of Breath  Brandi Berry does feel that she gets out of breath more easily that she used to when she exercises. 's shortness of breath appears to be obesity related and exercise induced. She has agreed to work on weight loss and gradually increase exercise to treat her exercise induced shortness of breath. Will continue to monitor closely.   Problem List Items Addressed This Visit       Genitourinary   Overactive bladder   On Gemtessa daily.  She has not tried pelvic floor physical therapy but is open to thinking about it.        Other   Pure hypercholesterolemia   Elevated cholesterol for at least a year.  She doesn't like this medication as it makes her hurt.  She is taking CoQ and is still hurting. Will change rosuvastatin to atorvastatin and see if the myalgias improve.       Relevant Medications   atorvastatin (LIPITOR) 10 MG tablet   Class 2 severe obesity with serious comorbidity and body mass index (BMI) of 37.0 to 37.9 in adult (HCC)   Starting weight: 214 Peak weight: 216 BMR: 1310 Previous obesity management: Weight Watcher's- lost 10lbs and gained back Body Fat %: 48.9% Starting Meal Plan:  Category 2  Meal Plan needs: salty crunchy snack options Likes/ Dislikes of meal plan: n/a       BMI 37.0-37.9, adult   Hyperglycemia   Elevated blood sugars in the past- no abnormal A1c seen.  Will repeat CMP and A1c along with a fasting insulin level today.       Relevant Orders   Comprehensive metabolic panel   Hemoglobin A1c   Insulin, random   Vitamin D deficiency   Last Vitamin D level in 2019 and was half of goal level of 60.  She is on 50mg  daily of Vitamin D.  Will draw vitamin D level today.      Relevant Orders   VITAMIN D 25 Hydroxy (Vit-D Deficiency, Fractures)  Other Visit Diagnoses       Other fatigue    -  Primary   Relevant Orders   EKG 12-Lead (Completed)   T4, free   T3   TSH     SOBOE (shortness of breath on exertion)       Relevant Orders   CBC with Differential/Platelet     Depression screening         Obesity with starting BMI of 37.9           Brandi Berry is currently in the action stage of change and her goal is to continue with weight loss efforts. I recommend Brandi Berry begin the structured treatment plan as follows:  She has agreed to Category 2 Plan  Exercise goals: Older adults should follow the adult guidelines. When older adults cannot meet the adult guidelines, they should be as physically active as their abilities and conditions will allow.  Behavioral modification strategies:increasing lean protein intake, increasing vegetables, no skipping meals, and better snacking choices  She was informed of the importance of frequent follow-up visits to maximize her success with intensive lifestyle modifications for her multiple health conditions. She was informed we would discuss her lab results at her next visit unless there is a critical issue that needs to be addressed sooner. Brandi Berry agreed to keep her next visit at the agreed upon time to discuss these results.  Labs ordered with plans to discuss at the next visit.   Attestation  Statements:  Reviewed by clinician on day of visit: allergies, medications, problem list, medical history, surgical history, family history, social history, and previous encounter notes. This is the patient's first visit at [facility]. The patient's NEW PATIENT PACKET was reviewed at length. Included in the packet: current and past health history, medications, allergies, ROS, gynecologic history (women only), surgical history, family history, social history, weight history, weight loss surgery history (for those that have had weight loss surgery), nutritional evaluation, mood and food questionnaire, PHQ9, Epworth questionnaire, sleep habits questionnaire, patient life and health improvement goals questionnaire. These will all be scanned into the patient's chart under media.   During the visit, I independently reviewed the patient's EKG, bioimpedance scale results, and indirect calorimeter results. I used this information to tailor a meal plan for the patient that will help her to lose weight and will improve her obesity-related conditions going forward. I performed a medically necessary appropriate examination and/or evaluation. I discussed the assessment and treatment plan with the patient. The patient was provided an opportunity to ask questions and all were answered. The patient agreed with the plan and demonstrated an understanding of the instructions. Labs were ordered at this visit and will be reviewed at the next visit unless more critical results need to be addressed immediately. Clinical information was updated and documented in the EMR.    A separate 15 minutes was spent on risk counseling (see above).   Time spent on visit including pre-visit chart review and post-visit charting and care was 45 minutes.   Reuben Likes, MD

## 2023-05-04 NOTE — Assessment & Plan Note (Signed)
 On Gemtessa daily.  She has not tried pelvic floor physical therapy but is open to thinking about it.

## 2023-05-04 NOTE — Assessment & Plan Note (Signed)
 Starting weight: 214 Peak weight: 216 BMR: 1310 Previous obesity management: Weight Watcher's- lost 10lbs and gained back Body Fat %: 48.9% Starting Meal Plan: Category 2  Meal Plan needs: salty crunchy snack options Likes/ Dislikes of meal plan: n/a

## 2023-05-04 NOTE — Assessment & Plan Note (Signed)
 Elevated cholesterol for at least a year.  She doesn't like this medication as it makes her hurt.  She is taking CoQ and is still hurting. Will change rosuvastatin to atorvastatin and see if the myalgias improve.

## 2023-05-04 NOTE — Assessment & Plan Note (Signed)
 Elevated blood sugars in the past- no abnormal A1c seen.  Will repeat CMP and A1c along with a fasting insulin level today.

## 2023-05-05 LAB — VITAMIN D 25 HYDROXY (VIT D DEFICIENCY, FRACTURES): Vit D, 25-Hydroxy: 37.4 ng/mL (ref 30.0–100.0)

## 2023-05-05 LAB — CBC WITH DIFFERENTIAL/PLATELET
Basophils Absolute: 0.1 10*3/uL (ref 0.0–0.2)
Basos: 1 %
EOS (ABSOLUTE): 0 10*3/uL (ref 0.0–0.4)
Eos: 1 %
Hematocrit: 42.7 % (ref 34.0–46.6)
Hemoglobin: 13.8 g/dL (ref 11.1–15.9)
Immature Grans (Abs): 0 10*3/uL (ref 0.0–0.1)
Immature Granulocytes: 0 %
Lymphocytes Absolute: 1.2 10*3/uL (ref 0.7–3.1)
Lymphs: 25 %
MCH: 30.6 pg (ref 26.6–33.0)
MCHC: 32.3 g/dL (ref 31.5–35.7)
MCV: 95 fL (ref 79–97)
Monocytes Absolute: 0.3 10*3/uL (ref 0.1–0.9)
Monocytes: 7 %
Neutrophils Absolute: 3.1 10*3/uL (ref 1.4–7.0)
Neutrophils: 66 %
Platelets: 284 10*3/uL (ref 150–450)
RBC: 4.51 x10E6/uL (ref 3.77–5.28)
RDW: 12.2 % (ref 11.7–15.4)
WBC: 4.8 10*3/uL (ref 3.4–10.8)

## 2023-05-05 LAB — COMPREHENSIVE METABOLIC PANEL
ALT: 11 IU/L (ref 0–32)
AST: 18 IU/L (ref 0–40)
Albumin: 4.1 g/dL (ref 3.9–4.9)
Alkaline Phosphatase: 103 IU/L (ref 44–121)
BUN/Creatinine Ratio: 17 (ref 12–28)
BUN: 13 mg/dL (ref 8–27)
Bilirubin Total: 0.7 mg/dL (ref 0.0–1.2)
CO2: 22 mmol/L (ref 20–29)
Calcium: 10 mg/dL (ref 8.7–10.3)
Chloride: 103 mmol/L (ref 96–106)
Creatinine, Ser: 0.75 mg/dL (ref 0.57–1.00)
Globulin, Total: 2.6 g/dL (ref 1.5–4.5)
Glucose: 83 mg/dL (ref 70–99)
Potassium: 4.6 mmol/L (ref 3.5–5.2)
Sodium: 141 mmol/L (ref 134–144)
Total Protein: 6.7 g/dL (ref 6.0–8.5)
eGFR: 86 mL/min/{1.73_m2} (ref >59)

## 2023-05-05 LAB — T3: T3, Total: 118 ng/dL (ref 71–180)

## 2023-05-05 LAB — INSULIN, RANDOM: INSULIN: 4.1 u[IU]/mL (ref 2.6–24.9)

## 2023-05-05 LAB — TSH: TSH: 2.85 u[IU]/mL (ref 0.450–4.500)

## 2023-05-05 LAB — HEMOGLOBIN A1C
Est. average glucose Bld gHb Est-mCnc: 114 mg/dL
Hgb A1c MFr Bld: 5.6 % (ref 4.8–5.6)

## 2023-05-05 LAB — T4, FREE: Free T4: 1.07 ng/dL (ref 0.82–1.77)

## 2023-05-12 DIAGNOSIS — R350 Frequency of micturition: Secondary | ICD-10-CM | POA: Diagnosis not present

## 2023-05-18 ENCOUNTER — Encounter (INDEPENDENT_AMBULATORY_CARE_PROVIDER_SITE_OTHER): Payer: Self-pay | Admitting: Family Medicine

## 2023-05-18 ENCOUNTER — Ambulatory Visit (INDEPENDENT_AMBULATORY_CARE_PROVIDER_SITE_OTHER): Payer: Medicare Other | Admitting: Family Medicine

## 2023-05-18 VITALS — BP 121/77 | HR 62 | Temp 97.9°F | Ht 63.0 in | Wt 213.0 lb

## 2023-05-18 DIAGNOSIS — Z6837 Body mass index (BMI) 37.0-37.9, adult: Secondary | ICD-10-CM | POA: Diagnosis not present

## 2023-05-18 DIAGNOSIS — E66812 Obesity, class 2: Secondary | ICD-10-CM | POA: Diagnosis not present

## 2023-05-18 DIAGNOSIS — E559 Vitamin D deficiency, unspecified: Secondary | ICD-10-CM | POA: Diagnosis not present

## 2023-05-18 DIAGNOSIS — E7800 Pure hypercholesterolemia, unspecified: Secondary | ICD-10-CM | POA: Diagnosis not present

## 2023-05-18 MED ORDER — VITAMIN D3 125 MCG (5000 UT) PO CAPS: 5000.0000 [IU] | ORAL_CAPSULE | Freq: Every day | ORAL | Status: AC

## 2023-05-18 NOTE — Assessment & Plan Note (Signed)
 Starting weight: 214 on 05/04/2023 Peak weight: 216 BMR: 1310 Previous obesity management: Weight Watcher's- lost 10lbs and gained back Body Fat %: 48.9% Starting Meal Plan: Category 2  Meal Plan needs: salty crunchy snack options Likes/ Dislikes of meal plan: n/a  See anthropometric data to monitor progress

## 2023-05-18 NOTE — Assessment & Plan Note (Signed)
 Noticing an improvement in her myalgias with changing her cholesterol medication.  Will continue current medication and recheck cholesterols in 3 months.

## 2023-05-18 NOTE — Assessment & Plan Note (Addendum)
 Discussed importance of vitamin d supplementation.  Vitamin d supplementation has been shown to decrease fatigue, decrease risk of progression to insulin resistance and then prediabetes, decreases risk of falling in older age and can even assist in decreasing depressive symptoms in PTSD.   Patient to increase OTC to [facility] international units a day.

## 2023-05-18 NOTE — Progress Notes (Signed)
 SUBJECTIVE:  Chief Complaint: Obesity  Interim History: First two weeks was challenging.  She has become very fond of tuna.  She has switched over to keto bread.  She only had eggs a couple of days.  She has eaten quite a bit of tuna and salad.  Biggest problem is cravings and snacking.  Mornings are fine with food choices and quantity.  Occasionally she would miss lunch and then feel very hungry and having a higher desire to snack.  She is wondering about how to incorporate alcohol.  No upcoming plans for events or activities.  Brandi Berry is here to discuss her progress with her obesity treatment plan. She is on the Category 2 Plan and states she is following her eating plan approximately 60 % of the time. She states she is not exercising.   OBJECTIVE: Visit Diagnoses: Problem List Items Addressed This Visit       Other   Pure hypercholesterolemia   Noticing an improvement in her myalgias with changing her cholesterol medication.  Will continue current medication and recheck cholesterols in 3 months.      Class 2 severe obesity with serious comorbidity and body mass index (BMI) of 37.0 to 37.9 in adult [facility]) - Primary   Starting weight: 214 on 05/04/2023 Peak weight: 216 BMR: 1310 Previous obesity management: Weight Watcher's- lost 10lbs and gained back Body Fat %: 48.9% Starting Meal Plan: Category 2  Meal Plan needs: salty crunchy snack options Likes/ Dislikes of meal plan: n/a  See anthropometric data to monitor progress       Vitamin D deficiency   Discussed importance of vitamin d supplementation.  Vitamin d supplementation has been shown to decrease fatigue, decrease risk of progression to insulin resistance and then prediabetes, decreases risk of falling in older age and can even assist in decreasing depressive symptoms in PTSD.   Patient to increase OTC to [facility] international units a day.        Vitals Temp: 97.9 F (36.6 C) BP: 121/77 Pulse Rate: 62 SpO2: 100  %   Anthropometric Measurements Height: 5\' 3"  (1.6 m) Weight: 213 lb (96.6 kg) BMI (Calculated): 37.74 Weight at Last Visit: 214 lb Weight Lost Since Last Visit: 1 Weight Gained Since Last Visit: 0 Starting Weight: 214 lb Total Weight Loss (lbs): 1 lb (0.454 kg) Peak Weight: 214 lb   Body Composition  Body Fat %: 48.8 % Fat Mass (lbs): 104 lbs Muscle Mass (lbs): 103.6 lbs Total Body Water (lbs): 74 lbs Visceral Fat Rating : 16   Other Clinical Data Today's Visit #: 2 Starting Date: 05/04/23 Comments: Cat 2     ASSESSMENT AND PLAN:  Diet: Brandi Berry is currently in the action stage of change. As such, her goal is to continue with weight loss efforts and has agreed to the Category 2 Plan.   Exercise:  Older adults should follow the adult guidelines. When older adults cannot meet the adult guidelines, they should be as physically active as their abilities and conditions will allow.  Behavior Modification:  We discussed the following Behavioral Modification Strategies today: increasing lean protein intake, increasing vegetables, no skipping meals, avoiding temptations, and planning for success.   No follow-ups on file.Marland Kitchen She was informed of the importance of frequent follow up visits to maximize her success with intensive lifestyle modifications for her multiple health conditions.  Attestation Statements:   Reviewed by clinician on day of visit: allergies, medications, problem list, medical history, surgical history, family history, social history,  and previous encounter notes.   Time spent on visit including pre-visit chart review and post-visit care and charting was 30 minutes  Reuben Likes, MD

## 2023-05-30 ENCOUNTER — Other Ambulatory Visit (INDEPENDENT_AMBULATORY_CARE_PROVIDER_SITE_OTHER): Payer: Self-pay | Admitting: Family Medicine

## 2023-05-30 DIAGNOSIS — E7800 Pure hypercholesterolemia, unspecified: Secondary | ICD-10-CM

## 2023-06-02 ENCOUNTER — Other Ambulatory Visit (INDEPENDENT_AMBULATORY_CARE_PROVIDER_SITE_OTHER): Payer: Self-pay | Admitting: Family Medicine

## 2023-06-02 DIAGNOSIS — E7800 Pure hypercholesterolemia, unspecified: Secondary | ICD-10-CM

## 2023-06-03 ENCOUNTER — Encounter (INDEPENDENT_AMBULATORY_CARE_PROVIDER_SITE_OTHER): Payer: Self-pay | Admitting: Family Medicine

## 2023-06-03 ENCOUNTER — Ambulatory Visit (INDEPENDENT_AMBULATORY_CARE_PROVIDER_SITE_OTHER): Admitting: Family Medicine

## 2023-06-03 VITALS — BP 138/64 | HR 66 | Temp 98.1°F | Ht 63.0 in | Wt 215.0 lb

## 2023-06-03 DIAGNOSIS — E678 Other specified hyperalimentation: Secondary | ICD-10-CM | POA: Insufficient documentation

## 2023-06-03 DIAGNOSIS — Z6838 Body mass index (BMI) 38.0-38.9, adult: Secondary | ICD-10-CM | POA: Diagnosis not present

## 2023-06-03 DIAGNOSIS — E669 Obesity, unspecified: Secondary | ICD-10-CM

## 2023-06-03 DIAGNOSIS — E7800 Pure hypercholesterolemia, unspecified: Secondary | ICD-10-CM

## 2023-06-03 MED ORDER — TOPIRAMATE 25 MG PO TABS: ORAL_TABLET | ORAL | 0 refills | Status: DC

## 2023-06-03 MED ORDER — LOMAIRA 8 MG PO TABS: ORAL_TABLET | ORAL | 0 refills | Status: DC

## 2023-06-03 NOTE — Assessment & Plan Note (Signed)
 Patient has been able to tolerate new statin prescription with significantly less myalgias.  Continue lipitor at current dose with no changes.

## 2023-06-03 NOTE — Assessment & Plan Note (Addendum)
 Medication options were discussed extensively with patient today.  Given availability, cost, insurance coverage and other medical comorbidities the decision was reached to start medications Lomaira and Topiramate.  These medications will be used as a substitute for brand name Qsymia in doses that are synanomous.  Patient understands this is an off label usage.  We discussed the titration schedule with the goal of 5% weight loss at 3 months at a treatment dose.  The first two weeks will be a starting dose of 25mg  of Topiramate and 4mg  of Lomaira.  After two weeks the patient will increase to 50mg  of Topiramate and 8mg  of Lomaira and will stay on this dose until the next appointment.  Controlled substance contract was discussed and signed today.  Prescriptions sent in. Starting weight of 215 so goal will be 5% by mid July which will be 10.5lb

## 2023-06-03 NOTE — Progress Notes (Signed)
 SUBJECTIVE:  Chief Complaint: Obesity  Interim History: Patient has been working toward total intake and still having cravings.  She is focusing on protein intake.  She is having significant salt and crunch cravings.  On days she doesn't have to work the cravings are worse.  She is able to get 75% in and mentions lunch tends to be the most difficult meal to get in.  She thinks lunch is the most difficult to get all the food in throughout the day. For snack calories she is doing 100 cal of nuts or a protein bar or a protein shake.  Montanna is here to discuss her progress with her obesity treatment plan. She is on the Category 2 Plan and states she is following her eating plan approximately 65-70 % of the time. She states she is walking some.   OBJECTIVE: Visit Diagnoses: Problem List Items Addressed This Visit       Other   Pure hypercholesterolemia   Patient has been able to tolerate new statin prescription with significantly less myalgias.  Continue lipitor at current dose with no changes.      Excessive carbohydrate intake - Primary   Medication options were discussed extensively with patient today.  Given availability, cost, insurance coverage and other medical comorbidities the decision was reached to start medications Lomaira and Topiramate.  These medications will be used as a substitute for brand name Qsymia in doses that are synanomous.  Patient understands this is an off label usage.  We discussed the titration schedule with the goal of 5% weight loss at 3 months at a treatment dose.  The first two weeks will be a starting dose of 25mg  of Topiramate and 4mg  of Lomaira.  After two weeks the patient will increase to 50mg  of Topiramate and 8mg  of Lomaira and will stay on this dose until the next appointment.  Controlled substance contract was discussed and signed today.  Prescriptions sent in. Starting weight of 215 so goal will be 5% by mid July which will be 10.5lb       Relevant  Medications   Phentermine HCl (LOMAIRA) 8 MG TABS   topiramate (TOPAMAX) 25 MG tablet   Other Visit Diagnoses       Obesity with starting BMI of 37.9       Relevant Medications   Phentermine HCl (LOMAIRA) 8 MG TABS     BMI 38.0-38.9,adult           Vitals Temp: 98.1 F (36.7 C) BP: 138/64 Pulse Rate: 66 SpO2: 100 %   Anthropometric Measurements Height: 5\' 3"  (1.6 m) Weight: 215 lb (97.5 kg) BMI (Calculated): 38.09 Weight at Last Visit: 213 lb Weight Lost Since Last Visit: 0 Weight Gained Since Last Visit: 2 Starting Weight: 215 lb Total Weight Loss (lbs): 0 lb (0 kg) Peak Weight: 214 lb   Body Composition  Body Fat %: 49.5 % Fat Mass (lbs): 106.4 lbs Muscle Mass (lbs): 103 lbs Total Body Water (lbs): 74.6 lbs Visceral Fat Rating : 16   Other Clinical Data Today's Visit #: 3 Starting Date: 05/04/23 Comments: Cat 2     ASSESSMENT AND PLAN:  Diet: Quin is currently in the action stage of change. As such, her goal is to continue with weight loss efforts and has agreed to the Category 2 Plan.   Exercise:  Older adults should follow the adult guidelines. When older adults cannot meet the adult guidelines, they should be as physically active as their abilities and  conditions will allow.  Behavior Modification:  We discussed the following Behavioral Modification Strategies today: increasing lean protein intake, decreasing simple carbohydrates, better snacking choices, and avoiding temptations. We discussed various medication options to help Janiqua with her weight loss efforts and we both agreed to start phentermine and topiramate per instructions above.  Return in about 2 weeks (around 06/17/2023).Marland Kitchen She was informed of the importance of frequent follow up visits to maximize her success with intensive lifestyle modifications for her multiple health conditions.  Attestation Statements:   Reviewed by clinician on day of visit: allergies, medications, problem  list, medical history, surgical history, family history, social history, and previous encounter notes.    Reuben Likes, MD

## 2023-06-09 ENCOUNTER — Telehealth (INDEPENDENT_AMBULATORY_CARE_PROVIDER_SITE_OTHER): Payer: Self-pay

## 2023-06-09 NOTE — Telephone Encounter (Signed)
 Patient Name: Brandi Berry Patient DOB: 03-18-1955  Patient ID: [id number] Status of Request: Deny  Medication Name: Lasandra Beech Tab 8mg  GPI/NDC: [id number] Decision Notes: Drugs when used for anorexia, weight loss, or weight gain are excluded from coverage under Medicare rules. Please refer to your Evidence of Coverage (EOC) section that references Part D drug coverage in your pharmacy plan documents for more information.

## 2023-06-21 ENCOUNTER — Other Ambulatory Visit (INDEPENDENT_AMBULATORY_CARE_PROVIDER_SITE_OTHER): Payer: Self-pay | Admitting: Family Medicine

## 2023-06-21 DIAGNOSIS — E7800 Pure hypercholesterolemia, unspecified: Secondary | ICD-10-CM

## 2023-06-22 ENCOUNTER — Ambulatory Visit (INDEPENDENT_AMBULATORY_CARE_PROVIDER_SITE_OTHER): Admitting: Family Medicine

## 2023-06-22 ENCOUNTER — Encounter (INDEPENDENT_AMBULATORY_CARE_PROVIDER_SITE_OTHER): Payer: Self-pay | Admitting: Family Medicine

## 2023-06-22 VITALS — BP 106/61 | HR 62 | Temp 98.0°F | Ht 63.0 in | Wt 211.0 lb

## 2023-06-22 DIAGNOSIS — E7800 Pure hypercholesterolemia, unspecified: Secondary | ICD-10-CM

## 2023-06-22 DIAGNOSIS — Z6837 Body mass index (BMI) 37.0-37.9, adult: Secondary | ICD-10-CM | POA: Diagnosis not present

## 2023-06-22 DIAGNOSIS — E678 Other specified hyperalimentation: Secondary | ICD-10-CM

## 2023-06-22 MED ORDER — LOMAIRA 8 MG PO TABS: 8.0000 mg | ORAL_TABLET | Freq: Every day | ORAL | 0 refills | Status: DC

## 2023-06-22 MED ORDER — TOPIRAMATE 50 MG PO TABS: 50.0000 mg | ORAL_TABLET | Freq: Every day | ORAL | 0 refills | Status: DC

## 2023-06-22 MED ORDER — ATORVASTATIN CALCIUM 10 MG PO TABS: 10.0000 mg | ORAL_TABLET | Freq: Every day | ORAL | 0 refills | Status: DC

## 2023-06-22 NOTE — Assessment & Plan Note (Signed)
 Patient doing well on atorvastatin  daily.  No myalgias.  She is asking for a 90 day supply now that she knows she can tolerate this.  [facility]

## 2023-06-22 NOTE — Progress Notes (Signed)
 SUBJECTIVE:  Chief Complaint: Obesity  Interim History: Patient has been trying to stay more consistent with her food intake.  The days she works she tends to eat less consistently on plan.  The days she does work she finds it difficult to get all the food in.  She may get a protein bar in as a substitution.  Starting to feel the medications working.  Next few weeks she only has work and helping take care of her mother- no other plans or obligations.   Maurine is here to discuss her progress with her obesity treatment plan. She is on the Category 2 Plan and states she is following her eating plan approximately 50 % of the time. She states she is not exercising.   OBJECTIVE: Visit Diagnoses: Problem List Items Addressed This Visit       Other   Pure hypercholesterolemia   Patient doing well on atorvastatin  daily.  No myalgias.  She is asking for a 90 day supply now that she knows she can tolerate this.  [facility].      Relevant Medications   atorvastatin  (LIPITOR) 10 MG tablet   BMI 37.0-37.9, adult   Excessive carbohydrate intake - Primary    Starting weight of 215 so goal will be 5% by mid July which will be 10.5lb.  Weight today of 211.  PDMP checked today.  No side effects mentioned.  Needs refill of meds today.  Will continue at 8mg  dose of lomaira  and 50mg  topiramate .        Relevant Medications   Phentermine  HCl (LOMAIRA ) 8 MG TABS   topiramate  (TOPAMAX ) 50 MG tablet   Morbid obesity (HCC)   Anthropometric Measurements Height: 5\' 3"  (1.6 m) Weight: 211 lb (95.7 kg) BMI (Calculated): 37.39 Weight at Last Visit: 215 lb Weight Lost Since Last Visit: 4 Weight Gained Since Last Visit: 0 Starting Weight: 214 lb Total Weight Loss (lbs): 14 lb (6.35 kg) Peak Weight: 214 Body Composition  Body Fat %: 49.2 % Fat Mass (lbs): 104 lbs Muscle Mass (lbs): 102 lbs Total Body Water (lbs): 73.8 lbs Visceral Fat Rating : 16 Other Clinical  Data Today's Visit #: 4 Starting Date: 05/04/23 Comments: Cat 2       Relevant Medications   Phentermine  HCl (LOMAIRA ) 8 MG TABS    No data recorded       06/22/2023   10:00 AM 06/03/2023   10:10 AM 06/03/2023   10:00 AM  Vitals with BMI  Height 5\' 3"   5\' 3"   Weight 211 lbs  215 lbs  BMI 37.39  38.09  Systolic 106 138 75  Diastolic 61 64 53  Pulse 62  66      ASSESSMENT AND PLAN:  Diet: Daesia is currently in the action stage of change. As such, her goal is to continue with weight loss efforts and has agreed to the Category 2 Plan.   Exercise:  Older adults should follow the adult guidelines. When older adults cannot meet the adult guidelines, they should be as physically active as their abilities and conditions will allow. and Older adults should determine their level of effort for physical activity relative to their level of fitness.  Behavior Modification:  We discussed the following Behavioral Modification Strategies today: increasing lean protein intake, decreasing simple carbohydrates, meal planning and cooking strategies, keeping healthy foods in the home, and avoiding temptations. We discussed various medication options to help Gatha with her weight loss efforts and  we both agreed to continue her current dose of lomaira  and topiramate  with no changes in dosage or frequency.  Return in about 3 weeks (around 07/13/2023).Aaron Aas She was informed of the importance of frequent follow up visits to maximize her success with intensive lifestyle modifications for her multiple health conditions.  Attestation Statements:   Reviewed by clinician on day of visit: allergies, medications, problem list, medical history, surgical history, family history, social history, and previous encounter notes.     Donaciano Frizzle, MD

## 2023-06-27 NOTE — Assessment & Plan Note (Signed)
 Starting weight of 215 so goal will be 5% by mid July which will be 10.5lb.  Weight today of 211.  PDMP checked today.  No side effects mentioned.  Needs refill of meds today.  Will continue at 8mg  dose of lomaira  and 50mg  topiramate .

## 2023-06-27 NOTE — Assessment & Plan Note (Signed)
 Anthropometric Measurements Height: 5\' 3"  (1.6 m) Weight: 211 lb (95.7 kg) BMI (Calculated): 37.39 Weight at Last Visit: 215 lb Weight Lost Since Last Visit: 4 Weight Gained Since Last Visit: 0 Starting Weight: 214 lb Total Weight Loss (lbs): 14 lb (6.35 kg) Peak Weight: 214 Body Composition  Body Fat %: 49.2 % Fat Mass (lbs): 104 lbs Muscle Mass (lbs): 102 lbs Total Body Water (lbs): 73.8 lbs Visceral Fat Rating : 16 Other Clinical Data Today's Visit #: 4 Starting Date: 05/04/23 Comments: Cat 2

## 2023-06-28 ENCOUNTER — Other Ambulatory Visit (INDEPENDENT_AMBULATORY_CARE_PROVIDER_SITE_OTHER): Payer: Self-pay | Admitting: Family Medicine

## 2023-06-28 DIAGNOSIS — E678 Other specified hyperalimentation: Secondary | ICD-10-CM

## 2023-06-29 ENCOUNTER — Other Ambulatory Visit: Payer: Self-pay | Admitting: Internal Medicine

## 2023-06-29 DIAGNOSIS — Z1231 Encounter for screening mammogram for malignant neoplasm of breast: Secondary | ICD-10-CM

## 2023-07-05 ENCOUNTER — Other Ambulatory Visit (INDEPENDENT_AMBULATORY_CARE_PROVIDER_SITE_OTHER): Payer: Self-pay | Admitting: Family Medicine

## 2023-07-05 DIAGNOSIS — E7800 Pure hypercholesterolemia, unspecified: Secondary | ICD-10-CM

## 2023-07-08 ENCOUNTER — Encounter (INDEPENDENT_AMBULATORY_CARE_PROVIDER_SITE_OTHER): Payer: Self-pay | Admitting: Family Medicine

## 2023-07-08 ENCOUNTER — Ambulatory Visit (INDEPENDENT_AMBULATORY_CARE_PROVIDER_SITE_OTHER): Admitting: Family Medicine

## 2023-07-08 VITALS — BP 104/70 | HR 61 | Temp 98.1°F | Ht 63.0 in | Wt 209.0 lb

## 2023-07-08 DIAGNOSIS — E7800 Pure hypercholesterolemia, unspecified: Secondary | ICD-10-CM

## 2023-07-08 DIAGNOSIS — Z6837 Body mass index (BMI) 37.0-37.9, adult: Secondary | ICD-10-CM | POA: Diagnosis not present

## 2023-07-08 DIAGNOSIS — E678 Other specified hyperalimentation: Secondary | ICD-10-CM

## 2023-07-08 MED ORDER — ATORVASTATIN CALCIUM 10 MG PO TABS: 10.0000 mg | ORAL_TABLET | Freq: Every day | ORAL | 0 refills | Status: DC

## 2023-07-08 MED ORDER — LOMAIRA 8 MG PO TABS: 8.0000 mg | ORAL_TABLET | Freq: Every day | ORAL | 0 refills | Status: DC

## 2023-07-08 MED ORDER — TOPIRAMATE 50 MG PO TABS: 50.0000 mg | ORAL_TABLET | Freq: Every day | ORAL | 0 refills | Status: DC

## 2023-07-08 NOTE — Assessment & Plan Note (Signed)
 Starting weight of 215 so goal will be 5% by mid July which will be 10.5lb.  Weight today of 209.  PDMP checked today.  No side effects mentioned.  Needs refill of meds today.  Will continue at 8mg  dose of lomaira  and 50mg  topiramate .  Needs an additional 5.5lb loss by mid July.

## 2023-07-08 NOTE — Progress Notes (Signed)
 SUBJECTIVE:  Chief Complaint: Obesity  Interim History: Patient has been trying to stay more consistently on a the meal plan.  She wants to eat a french fry or potato.  She isn't eating much of the bread options.  She is doing wheat bread and only doing 1 slice.  She is trying to spread out her protein intake throughout the day and also stay conscious of the food choices she is making.  She is still experiencing cravings. She has had a bit of a headache this am.  She is planning to go see her mother this weekend for Mother's Day.   Brandi Berry is here to discuss her progress with her obesity treatment plan. She is on the Category 2 Plan and states she is following her eating plan approximately 50 % of the time. She states she is walking a lot.   OBJECTIVE: Visit Diagnoses: Problem List Items Addressed This Visit       Other   Pure hypercholesterolemia - Primary   Tolerating atorvastatin  well with no side effects.  Will refill lipitor at same dose today.        Relevant Medications   atorvastatin  (LIPITOR) 10 MG tablet   BMI 37.0-37.9, adult   Excessive carbohydrate intake    Starting weight of 215 so goal will be 5% by mid July which will be 10.5lb.  Weight today of 209.  PDMP checked today.  No side effects mentioned.  Needs refill of meds today.  Will continue at 8mg  dose of lomaira  and 50mg  topiramate .  Needs an additional 5.5lb loss by mid July.        Relevant Medications   Phentermine  HCl (LOMAIRA ) 8 MG TABS   topiramate  (TOPAMAX ) 50 MG tablet   Morbid obesity (HCC)   Anthropometric Measurements Height: 5\' 3"  (1.6 m) Weight: 209 lb (94.8 kg) BMI (Calculated): 37.03 Weight at Last Visit: 211 lb Weight Lost Since Last Visit: 2 Weight Gained Since Last Visit: 0 Starting Weight: 214 lb Total Weight Loss (lbs): 5 lb (2.268 kg) Peak Weight: 214 lb Body Composition  Body Fat %: 49 % Fat Mass (lbs): 102.6 lbs Muscle Mass (lbs): 101.2 lbs Total Body Water (lbs): 72.6  lbs Visceral Fat Rating : 16 Other Clinical Data Fasting: no Labs: no Today's Visit #: 5 Starting Date: 05/04/23 Comments: Cat 2       Relevant Medications   Phentermine  HCl (LOMAIRA ) 8 MG TABS    No data recorded       07/08/2023    9:00 AM 06/22/2023   10:00 AM 06/03/2023   10:10 AM  Vitals with BMI  Height 5\' 3"  5\' 3"    Weight 209 lbs 211 lbs   BMI 37.03 37.39   Systolic 104 106 914  Diastolic 70 61 64  Pulse 61 62       ASSESSMENT AND PLAN:  Diet: Brandi Berry is currently in the action stage of change. As such, her goal is to continue with weight loss efforts and has agreed to keeping a food journal and adhering to recommended goals of 200-250, 300-350 and 400-450 calories and 20, 25, or 35 or more grams protein for breakfast, lunch, dinner.   Exercise:  Older adults should follow the adult guidelines. When older adults cannot meet the adult guidelines, they should be as physically active as their abilities and conditions will allow.  Behavior Modification:  We discussed the following Behavioral Modification Strategies today: increasing lean protein intake, decreasing simple carbohydrates, increasing vegetables, meal planning  and cooking strategies, keeping healthy foods in the home, planning for success, and keep a strict food journal. We discussed various medication options to help Brandi Berry with her weight loss efforts and we both agreed to continue phentermine  and topiramate  at current doses.  Return in about 4 weeks (around 08/05/2023).   She was informed of the importance of frequent follow up visits to maximize her success with intensive lifestyle modifications for her multiple health conditions.  Attestation Statements:   Reviewed by clinician on day of visit: allergies, medications, problem list, medical history, surgical history, family history, social history, and previous encounter notes.     Donaciano Frizzle, MD

## 2023-07-19 NOTE — Assessment & Plan Note (Signed)
 Anthropometric Measurements Height: 5\' 3"  (1.6 m) Weight: 209 lb (94.8 kg) BMI (Calculated): 37.03 Weight at Last Visit: 211 lb Weight Lost Since Last Visit: 2 Weight Gained Since Last Visit: 0 Starting Weight: 214 lb Total Weight Loss (lbs): 5 lb (2.268 kg) Peak Weight: 214 lb Body Composition  Body Fat %: 49 % Fat Mass (lbs): 102.6 lbs Muscle Mass (lbs): 101.2 lbs Total Body Water (lbs): 72.6 lbs Visceral Fat Rating : 16 Other Clinical Data Fasting: no Labs: no Today's Visit #: 5 Starting Date: 05/04/23 Comments: Cat 2

## 2023-07-19 NOTE — Assessment & Plan Note (Signed)
 Tolerating atorvastatin  well with no side effects.  Will refill lipitor at same dose today.

## 2023-08-03 ENCOUNTER — Encounter (INDEPENDENT_AMBULATORY_CARE_PROVIDER_SITE_OTHER): Payer: Self-pay | Admitting: Family Medicine

## 2023-08-03 ENCOUNTER — Ambulatory Visit

## 2023-08-03 ENCOUNTER — Ambulatory Visit (INDEPENDENT_AMBULATORY_CARE_PROVIDER_SITE_OTHER): Admitting: Family Medicine

## 2023-08-03 VITALS — BP 114/78 | HR 59 | Temp 98.1°F | Ht 63.0 in | Wt 209.0 lb

## 2023-08-03 DIAGNOSIS — Z6837 Body mass index (BMI) 37.0-37.9, adult: Secondary | ICD-10-CM

## 2023-08-03 DIAGNOSIS — E678 Other specified hyperalimentation: Secondary | ICD-10-CM

## 2023-08-03 MED ORDER — TOPIRAMATE 50 MG PO TABS
75.0000 mg | ORAL_TABLET | Freq: Every day | ORAL | 0 refills | Status: DC
Start: 2023-08-03 — End: 2023-09-02

## 2023-08-03 MED ORDER — LOMAIRA 8 MG PO TABS
12.0000 mg | ORAL_TABLET | Freq: Every day | ORAL | 0 refills | Status: DC
Start: 2023-08-03 — End: 2023-09-02

## 2023-08-03 NOTE — Assessment & Plan Note (Signed)
 Starting weight of 215 so goal will be 5% by mid July which will be 10.5lb.  Weight today of 209.  PDMP checked today.  No side effects mentioned.  Needs refill of meds today.  Needs an additional 5.5lb loss by mid July.  Will increase topiramate  to 75mg  daily and phentermine  to 12mg  daily.  No side effects mentioned.

## 2023-08-03 NOTE — Progress Notes (Signed)
 SUBJECTIVE:  Chief Complaint: Obesity  Interim History: Patient mentions her mother has been very sick and that has really affected her.  She is getting bored with eating the same thing over and over again.  She is starting to realize more and more the nutrition of the foods she previously ate.   Brandi Berry is here to discuss her progress with her obesity treatment plan. She is on the Journaling plan 200-250 calories 20 grams of protein for Breakfast, 300-350 calories with 25 grams of protein for lunch, and 40-450 calories and 35 grams of protein for dinner and states she is following her eating plan approximately 75 % of the time. She states she is not exercising as much.   OBJECTIVE: Visit Diagnoses: Problem List Items Addressed This Visit       Other   BMI 37.0-37.9, adult   Excessive carbohydrate intake - Primary    Starting weight of 215 so goal will be 5% by mid July which will be 10.5lb.  Weight today of 209.  PDMP checked today.  No side effects mentioned.  Needs refill of meds today.  Needs an additional 5.5lb loss by mid July.  Will increase topiramate  to 75mg  daily and phentermine  to 12mg  daily.  No side effects mentioned.        Relevant Medications   Phentermine  HCl (LOMAIRA ) 8 MG TABS   topiramate  (TOPAMAX ) 50 MG tablet   Morbid obesity (HCC)   Relevant Medications   Phentermine  HCl (LOMAIRA ) 8 MG TABS    Vitals Temp: 98.1 F (36.7 C) BP: 114/78 Pulse Rate: (!) 59 SpO2: 99 %   Anthropometric Measurements Height: 5\' 3"  (1.6 m) Weight: 209 lb (94.8 kg) BMI (Calculated): 37.03 Weight at Last Visit: 209 lb Weight Lost Since Last Visit: 0 Weight Gained Since Last Visit: 0 Starting Weight: 214 lb Total Weight Loss (lbs): 5 lb (2.268 kg) Peak Weight: 214 lb   Body Composition  Body Fat %: 49.2 % Fat Mass (lbs): 103 lbs Muscle Mass (lbs): 101 lbs Total Body Water (lbs): 73 lbs Visceral Fat Rating : 16   Other Clinical Data Today's Visit #: 6 Starting  Date: 05/04/23 Comments: Cat 2     ASSESSMENT AND PLAN:  Diet: Brandi Berry is currently in the action stage of change. As such, her goal is to continue with weight loss efforts and has agreed to the Category 2 Plan and keeping a food journal and adhering to recommended goals of 1100-1200 calories and 85 or more grams of protein daily.  Exercise:  Older adults should do exercises that maintain or improve balance if they are at risk of falling.  and Older adults should determine their level of effort for physical activity relative to their level of fitness.  Behavior Modification:  We discussed the following Behavioral Modification Strategies today: increasing lean protein intake, decreasing simple carbohydrates, increasing vegetables, meal planning and cooking strategies, and keeping healthy foods in the home. We discussed various medication options to help Brandi Berry with her weight loss efforts and we both agreed to increase phentermine  and topiramate  to 12mg  and 75mg  daily.  Return in about 4 weeks (around 08/31/2023).   She was informed of the importance of frequent follow up visits to maximize her success with intensive lifestyle modifications for her multiple health conditions.  Attestation Statements:   Reviewed by clinician on day of visit: allergies, medications, problem list, medical history, surgical history, family history, social history, and previous encounter notes.     Donaciano Frizzle,  MD

## 2023-08-04 ENCOUNTER — Ambulatory Visit

## 2023-08-10 ENCOUNTER — Ambulatory Visit

## 2023-08-18 ENCOUNTER — Ambulatory Visit
Admission: RE | Admit: 2023-08-18 | Discharge: 2023-08-18 | Disposition: A | Discharge status: Home / Self Care | Source: Ambulatory Visit | Attending: Internal Medicine | Admitting: Internal Medicine

## 2023-08-18 DIAGNOSIS — Z1231 Encounter for screening mammogram for malignant neoplasm of breast: Secondary | ICD-10-CM

## 2023-08-29 ENCOUNTER — Other Ambulatory Visit (INDEPENDENT_AMBULATORY_CARE_PROVIDER_SITE_OTHER): Payer: Self-pay | Admitting: Family Medicine

## 2023-08-29 DIAGNOSIS — E678 Other specified hyperalimentation: Secondary | ICD-10-CM

## 2023-09-02 ENCOUNTER — Encounter (INDEPENDENT_AMBULATORY_CARE_PROVIDER_SITE_OTHER): Payer: Self-pay | Admitting: Family Medicine

## 2023-09-02 ENCOUNTER — Ambulatory Visit (INDEPENDENT_AMBULATORY_CARE_PROVIDER_SITE_OTHER): Admitting: Family Medicine

## 2023-09-02 VITALS — BP 101/70 | HR 64 | Temp 98.0°F | Ht 63.0 in | Wt 207.0 lb

## 2023-09-02 DIAGNOSIS — Z6836 Body mass index (BMI) 36.0-36.9, adult: Secondary | ICD-10-CM | POA: Diagnosis not present

## 2023-09-02 DIAGNOSIS — E678 Other specified hyperalimentation: Secondary | ICD-10-CM

## 2023-09-02 DIAGNOSIS — E631 Imbalance of constituents of food intake: Secondary | ICD-10-CM

## 2023-09-02 DIAGNOSIS — E669 Obesity, unspecified: Secondary | ICD-10-CM

## 2023-09-02 MED ORDER — LOMAIRA 8 MG PO TABS: 12.0000 mg | ORAL_TABLET | Freq: Every day | ORAL | 0 refills | Status: DC

## 2023-09-02 MED ORDER — TOPIRAMATE 50 MG PO TABS: 75.0000 mg | ORAL_TABLET | Freq: Every day | ORAL | 0 refills | Status: DC

## 2023-09-02 NOTE — Progress Notes (Signed)
   SUBJECTIVE:  Chief Complaint: Obesity  Interim History: Since last appointment patient has been trying to stay cool out of the heat.  She is trying to do some stretching and exercising inside.  She is trying to get protein in daily which is getting progressively more difficult on the medication she is on.  She voices that she then has to be focused on eating protein.   Brandi Berry is here to discuss her progress with her obesity treatment plan. She is on the Category 2 Plan and states she is following her eating plan approximately 50 % of the time. She states she is exercising inside 15 minutes 2 times per week.   OBJECTIVE: Visit Diagnoses: Problem List Items Addressed This Visit       Other   Excessive carbohydrate intake - Primary   Relevant Medications   Phentermine  HCl (LOMAIRA ) 8 MG TABS   topiramate  (TOPAMAX ) 50 MG tablet   Morbid obesity (HCC)   Relevant Medications   Phentermine  HCl (LOMAIRA ) 8 MG TABS   Other Visit Diagnoses       BMI 36.0-36.9,adult             09/02/2023   10:00 AM 08/03/2023   10:00 AM 07/08/2023    9:00 AM  Vitals with BMI  Height 5' 3 5' 3 5' 3  Weight 207 lbs 209 lbs 209 lbs  BMI 36.68 37.03 37.03  Systolic 101 114 895  Diastolic 70 78 70  Pulse 64 59 61    No data recorded  No data recorded  No data recorded  No data recorded    ASSESSMENT AND PLAN: Assessment & Plan Excessive carbohydrate intake  Starting weight of 215 so goal will be 5% by mid July which will be 10.5lb.  Weight today of 207.  PDMP checked today.  No side effects mentioned.  Needs refill of meds today.  Needs an additional 3.5lb loss by mid July.  Will continue current dose of meds.   Obesity with starting BMI of 37.9  BMI 36.0-36.9,adult    Diet: Brandi Berry is currently in the action stage of change. As such, her goal is to continue with weight loss efforts and has agreed to keeping a food journal and adhering to recommended goals of 1100-1200 calories and  85 or more grams protein daily.   Exercise:  Older adults should determine their level of effort for physical activity relative to their level of fitness.  Behavior Modification:  We discussed the following Behavioral Modification Strategies today: increasing lean protein intake, decreasing simple carbohydrates, increasing vegetables, planning for success, and keep a strict food journal. We discussed various medication options to help Brandi Berry with her weight loss efforts and we both agreed to continue current medications at 12mg  and 75mg  dose.  Return in about 4 weeks (around 09/30/2023).   She was informed of the importance of frequent follow up visits to maximize her success with intensive lifestyle modifications for her multiple health conditions.  Attestation Statements:   Reviewed by clinician on day of visit: allergies, medications, problem list, medical history, surgical history, family history, social history, and previous encounter notes.    [name], MD

## 2023-09-12 NOTE — Assessment & Plan Note (Signed)
 Starting weight of 215 so goal will be 5% by mid July which will be 10.5lb.  Weight today of 207.  PDMP checked today.  No side effects mentioned.  Needs refill of meds today.  Needs an additional 3.5lb loss by mid July.  Will continue current dose of meds.

## 2023-09-30 ENCOUNTER — Encounter (INDEPENDENT_AMBULATORY_CARE_PROVIDER_SITE_OTHER): Payer: Self-pay | Admitting: Family Medicine

## 2023-09-30 ENCOUNTER — Ambulatory Visit (INDEPENDENT_AMBULATORY_CARE_PROVIDER_SITE_OTHER): Admitting: Family Medicine

## 2023-09-30 VITALS — BP 106/52 | HR 66 | Temp 97.9°F | Ht 63.0 in | Wt 207.0 lb

## 2023-09-30 DIAGNOSIS — Z6836 Body mass index (BMI) 36.0-36.9, adult: Secondary | ICD-10-CM | POA: Diagnosis not present

## 2023-09-30 DIAGNOSIS — E669 Obesity, unspecified: Secondary | ICD-10-CM | POA: Diagnosis not present

## 2023-09-30 DIAGNOSIS — E678 Other specified hyperalimentation: Secondary | ICD-10-CM

## 2023-09-30 DIAGNOSIS — E7800 Pure hypercholesterolemia, unspecified: Secondary | ICD-10-CM | POA: Diagnosis not present

## 2023-09-30 MED ORDER — LOMAIRA 8 MG PO TABS: 12.0000 mg | ORAL_TABLET | Freq: Every day | ORAL | 0 refills | Status: DC

## 2023-09-30 MED ORDER — TOPIRAMATE 50 MG PO TABS: 75.0000 mg | ORAL_TABLET | Freq: Every day | ORAL | 0 refills | Status: DC

## 2023-09-30 NOTE — Progress Notes (Signed)
 SUBJECTIVE:  Chief Complaint: Obesity  Interim History: Patient has had a hot last month.  She has not been able to do much outside due to the heat and tried to do stuff inside but wasn't as consistent.  She has implemented smoothies into her daily intake and mentions that she is then not very hungry so she can't much else in.  She tends to eat the most in the am. She is going out of the country with friends in September to [place] for about 10 days.  She is excited about this trip.   Brandi Berry is here to discuss her progress with her obesity treatment plan. She is on the 1100-1200 calories and 75 or more grams of protein and states she is following her eating plan approximately 50 % of the time. She states she is exercising 15 minutes 2 times per week.   OBJECTIVE: Visit Diagnoses: Problem List Items Addressed This Visit       Other   Pure hypercholesterolemia   Excessive carbohydrate intake - Primary   Relevant Medications   Phentermine  HCl (LOMAIRA ) 8 MG TABS   topiramate  (TOPAMAX ) 50 MG tablet   Morbid obesity (HCC)   Relevant Medications   Phentermine  HCl (LOMAIRA ) 8 MG TABS   Other Visit Diagnoses       BMI 36.0-36.9,adult           No data recorded       ASSESSMENT AND PLAN: Assessment & Plan Excessive carbohydrate intake  Starting weight of 215 so goal will be 5% by mid July which will be 10.5lb.  Weight today of 207.  PDMP checked today.  No side effects mentioned.  Needs refill of meds today.  If patient has not lost additional 3.5 pounds by next appointment will need to increase to max dose phentermine  and topiramate  for 3 months to see if patient can achieve 5% loss  Pure hypercholesterolemia LDL in December of last year at 125 with triglycerides at 71 and HDL at 76.  Patient has been working on lifestyle changes since that time.  Will need repeat labs within the next month or 2 with either primary care or us  to reevaluate patient's cholesterol is now that she  has implemented lifestyle changes BMI 36.0-36.9,adult  Obesity with starting BMI of 37.9 Anthropometric Measurements Height: 5' 3 (1.6 m) Weight: 207 lb (93.9 kg) BMI (Calculated): 36.68 Weight at Last Visit: 207lb Weight Lost Since Last Visit: 0 Weight Gained Since Last Visit: 0 Starting Weight: 214lb Total Weight Loss (lbs): 7 lb (3.175 kg) Peak Weight: 214lb Body Composition  Body Fat %: 48.5 % Fat Mass (lbs): 100.8 lbs Muscle Mass (lbs): 101.6 lbs Total Body Water (lbs): 72.4 lbs Visceral Fat Rating : 15 Other Clinical Data Fasting: no Labs: no Today's Visit #: 8 Starting Date: 05/04/23 Comments: Cat 2    Diet: Brandi Berry is currently in the action stage of change. As such, her goal is to continue with weight loss efforts and has agreed to keeping a food journal and adhering to recommended goals of 1100-1200 calories and 80 or more grams of protein.   Exercise:  Older adults should determine their level of effort for physical activity relative to their level of fitness.  Behavior Modification:  We discussed the following Behavioral Modification Strategies today: increasing lean protein intake, decreasing simple carbohydrates, increasing vegetables, meal planning and cooking strategies, keeping healthy foods in the home, and keep a strict food journal. We discussed various medication options to help  Brandi Berry with her weight loss efforts and we both agreed to continue current meds of phentermine  and topiramate  at same dose.  Return in about 4 weeks (around 10/28/2023).   She was informed of the importance of frequent follow up visits to maximize her success with intensive lifestyle modifications for her multiple health conditions.  Attestation Statements:   Reviewed by clinician on day of visit: allergies, medications, problem list, medical history, surgical history, family history, social history, and previous encounter notes.   Brandi Cho, MD

## 2023-10-02 ENCOUNTER — Other Ambulatory Visit (INDEPENDENT_AMBULATORY_CARE_PROVIDER_SITE_OTHER): Payer: Self-pay | Admitting: Family Medicine

## 2023-10-02 DIAGNOSIS — E7800 Pure hypercholesterolemia, unspecified: Secondary | ICD-10-CM

## 2023-10-06 DIAGNOSIS — R350 Frequency of micturition: Secondary | ICD-10-CM | POA: Diagnosis not present

## 2023-10-07 DIAGNOSIS — M25571 Pain in right ankle and joints of right foot: Secondary | ICD-10-CM | POA: Diagnosis not present

## 2023-10-07 NOTE — Assessment & Plan Note (Signed)
 Anthropometric Measurements Height: 5' 3 (1.6 m) Weight: 207 lb (93.9 kg) BMI (Calculated): 36.68 Weight at Last Visit: 207lb Weight Lost Since Last Visit: 0 Weight Gained Since Last Visit: 0 Starting Weight: 214lb Total Weight Loss (lbs): 7 lb (3.175 kg) Peak Weight: 214lb Body Composition  Body Fat %: 48.5 % Fat Mass (lbs): 100.8 lbs Muscle Mass (lbs): 101.6 lbs Total Body Water (lbs): 72.4 lbs Visceral Fat Rating : 15 Other Clinical Data Fasting: no Labs: no Today's Visit #: 8 Starting Date: 05/04/23 Comments: Cat 2

## 2023-10-07 NOTE — Assessment & Plan Note (Signed)
 LDL in December of last year at 125 with triglycerides at 71 and HDL at 76.  Patient has been working on lifestyle changes since that time.  Will need repeat labs within the next month or 2 with either primary care or us  to reevaluate patient's cholesterol is now that she has implemented lifestyle changes

## 2023-10-07 NOTE — Assessment & Plan Note (Signed)
 Starting weight of 215 so goal will be 5% by mid July which will be 10.5lb.  Weight today of 207.  PDMP checked today.  No side effects mentioned.  Needs refill of meds today.  If patient has not lost additional 3.5 pounds by next appointment will need to increase to max dose phentermine  and topiramate  for 3 months to see if patient can achieve 5% loss

## 2023-10-12 DIAGNOSIS — M76821 Posterior tibial tendinitis, right leg: Secondary | ICD-10-CM | POA: Diagnosis not present

## 2023-10-15 DIAGNOSIS — M76821 Posterior tibial tendinitis, right leg: Secondary | ICD-10-CM | POA: Diagnosis not present

## 2023-10-15 DIAGNOSIS — M25571 Pain in right ankle and joints of right foot: Secondary | ICD-10-CM | POA: Diagnosis not present

## 2023-10-18 DIAGNOSIS — H40013 Open angle with borderline findings, low risk, bilateral: Secondary | ICD-10-CM | POA: Diagnosis not present

## 2023-10-19 ENCOUNTER — Other Ambulatory Visit: Payer: Medicare Other

## 2023-10-19 DIAGNOSIS — Z6836 Body mass index (BMI) 36.0-36.9, adult: Secondary | ICD-10-CM

## 2023-10-19 DIAGNOSIS — Z0000 Encounter for general adult medical examination without abnormal findings: Secondary | ICD-10-CM | POA: Diagnosis not present

## 2023-10-19 DIAGNOSIS — E7800 Pure hypercholesterolemia, unspecified: Secondary | ICD-10-CM | POA: Diagnosis not present

## 2023-10-19 DIAGNOSIS — Z1329 Encounter for screening for other suspected endocrine disorder: Secondary | ICD-10-CM

## 2023-10-19 DIAGNOSIS — N3281 Overactive bladder: Secondary | ICD-10-CM | POA: Diagnosis not present

## 2023-10-19 LAB — CBC WITH DIFFERENTIAL/PLATELET
Absolute Lymphocytes: 948 {cells}/uL (ref 850–3900)
Absolute Monocytes: 330 {cells}/uL (ref 200–950)
Basophils Absolute: 18 {cells}/uL (ref 0–200)
Basophils Relative: 0.3 %
Eosinophils Absolute: 0 {cells}/uL — ABNORMAL LOW (ref 15–500)
Eosinophils Relative: 0 %
HCT: 41.6 % (ref 35.0–45.0)
Hemoglobin: 13.5 g/dL (ref 11.7–15.5)
MCH: 30.7 pg (ref 27.0–33.0)
MCHC: 32.5 g/dL (ref 32.0–36.0)
MCV: 94.5 fL (ref 80.0–100.0)
MPV: 10.1 fL (ref 7.5–12.5)
Monocytes Relative: 5.5 %
Neutro Abs: 4704 {cells}/uL (ref 1500–7800)
Neutrophils Relative %: 78.4 %
Platelets: 299 Thousand/uL (ref 140–400)
RBC: 4.4 Million/uL (ref 3.80–5.10)
RDW: 12.5 % (ref 11.0–15.0)
Total Lymphocyte: 15.8 %
WBC: 6 Thousand/uL (ref 3.8–10.8)

## 2023-10-19 LAB — COMPLETE METABOLIC PANEL WITHOUT GFR
AG Ratio: 1.8 (calc) (ref 1.0–2.5)
ALT: 9 U/L (ref 6–29)
AST: 13 U/L (ref 10–35)
Albumin: 3.9 g/dL (ref 3.6–5.1)
Alkaline phosphatase (APISO): 76 U/L (ref 37–153)
BUN: 12 mg/dL (ref 7–25)
CO2: 27 mmol/L (ref 20–32)
Calcium: 9.3 mg/dL (ref 8.6–10.4)
Chloride: 109 mmol/L (ref 98–110)
Creat: 0.76 mg/dL (ref 0.60–1.00)
Globulin: 2.2 g/dL (ref 1.9–3.7)
Glucose, Bld: 99 mg/dL (ref 65–99)
Potassium: 4.7 mmol/L (ref 3.5–5.3)
Sodium: 142 mmol/L (ref 135–146)
Total Bilirubin: 0.5 mg/dL (ref 0.2–1.2)
Total Protein: 6.1 g/dL (ref 6.1–8.1)

## 2023-10-19 LAB — LIPID PANEL
Cholesterol: 190 mg/dL (ref <200)
HDL: 68 mg/dL (ref >=50)
LDL Cholesterol (Calc): 108 mg/dL — ABNORMAL HIGH
Non-HDL Cholesterol (Calc): 122 mg/dL (ref <130)
Total CHOL/HDL Ratio: 2.8 (calc) (ref <5.0)
Triglycerides: 62 mg/dL (ref <150)

## 2023-10-19 LAB — TSH: TSH: 0.89 m[IU]/L (ref 0.40–4.50)

## 2023-10-20 DIAGNOSIS — L80 Vitiligo: Secondary | ICD-10-CM | POA: Diagnosis not present

## 2023-10-20 DIAGNOSIS — L900 Lichen sclerosus et atrophicus: Secondary | ICD-10-CM | POA: Diagnosis not present

## 2023-10-25 NOTE — Progress Notes (Signed)
 Annual Wellness Visit   Patient Care Team: Perri Ronal PARAS, MD as PCP - General (Internal Medicine)  Visit Date: 10/28/23   Chief Complaint  Patient presents with   Annual Exam   Medicare Wellness   Subjective:  Patient: Brandi Berry, Female DOB: Apr 14, 1955, 68 y.o. MRN: [id number] Vitals:   10/28/23 0950  BP: 120/70   Brandi Berry is a 68 y.o. Female who presents today for her Annual Wellness Visit. Patient has Migraine headache; Allergic rhinitis; Pruritus; Medication overuse headache; Morning headache; Somnolence, daytime; Chronic migraine without aura without status migrainosus, not intractable; Pure hypercholesterolemia; Elevated blood pressure reading in office with diagnosis of hypertension; Class 2 severe obesity with serious comorbidity and body mass index (BMI) of 37.0 to 37.9 in adult [facility]); BMI 37.0-37.9, adult; Hyperglycemia; Vitamin D  deficiency; Overactive bladder; Excessive carbohydrate intake; and Morbid obesity (HCC) on their problem list.  She says that she will be going on a trip to [place] soon and is requesting prophylactic antibiotics for travel.   History of Hypertension with Blood Pressure: normotensive today at 120/70.   History of hypercholesterolemia treated with Atorvastatin  10 mg daily. As of  10/15/2022 her CHOL elevated at 235 and LDL is elevated at 143, Up from 230 and 138. She has not been taking rosuvastatin  due to body aches she has had while taking it     History of migraine headaches and has seen Dr. Ines in 2018   Mixed stress and urge urinary incontinence seen by Dr. Keneth in April 2023 - for which she takes Gamtesa 75 mg daily.   History of lichen sclerosis seen by dermatologist.     History of cataracts status post bilateral cataract surgery December 2022 and January 2023   History of epicondylitis of right elbow. Treated with anti-inflammatory medication in February 2023   History of allergies managed by claratin    History of Vitamin-D Deficiency treated with Vitamin D3 daily by mouth.    History of obesity for which she takes Phentermine  8 mg and Topiramate  50 mg daily for weight loss. Followed by the healthy weight [facility] wellness clinic and was recommended to eat  more protein, which she is working on.   She said she was having weakness in her right ankle that made it difficult to walk   Says she is going to make an appointment at the gynecologist soon.    Vaccine counseling : Covid, Influenza, PNA vaccines due. HBV vaccine discontinued     Labs 10/19/2023  LDL: 108 otherwise labs WNL   Mammogram 06/18/ 2025 with repeat recommendation of 08/18/2023.   Colonoscopy 12/22/2016  found the perianal and digital rectal exams were normal, entire examined colon apeared normal on direct and retroflexion views with repeat recommendation of 12/23/2026.  Bone Density 04/28/21  T-score at 0.2 was considered normal according to world health organization criteria.    Objective:  Vitals: body mass index is 37.8 kg/m. Today's Vitals   10/28/23 0950 10/28/23 1025  BP: 120/70   Weight: 210 lb (95.3 kg)   Height: 5' 2.5 (1.588 m)   PainSc: 5  5   PainLoc: Ankle    Physical Exam Vitals and nursing note reviewed. Exam conducted with a chaperone present Gae Hunnewell, CMA).  Constitutional:      General: She is not in acute distress.    Appearance: Normal appearance. She is not ill-appearing or toxic-appearing.  HENT:     Head: Normocephalic and atraumatic.     Right Ear: Hearing, tympanic  membrane, ear canal and external ear normal.     Left Ear: Hearing, tympanic membrane, ear canal and external ear normal.     Mouth/Throat:     Pharynx: Oropharynx is clear.  Eyes:     Extraocular Movements: Extraocular movements intact.     Pupils: Pupils are equal, round, and reactive to light.  Neck:     Thyroid : No thyroid  mass, thyromegaly or thyroid  tenderness.     Vascular: No carotid bruit.   Cardiovascular:     Rate and Rhythm: Normal rate and regular rhythm. No extrasystoles are present.    Pulses:          Dorsalis pedis pulses are 2+ on the right side and 2+ on the left side.     Heart sounds: Normal heart sounds. No murmur heard.    No friction rub. No gallop.  Pulmonary:     Effort: Pulmonary effort is normal.     Breath sounds: Normal breath sounds. No decreased breath sounds, wheezing, rhonchi or rales.  Chest:     Chest wall: No mass.  Abdominal:     Palpations: Abdomen is soft. There is no hepatomegaly, splenomegaly or mass.     Tenderness: There is no abdominal tenderness.     Hernia: No hernia is present.  Musculoskeletal:     Cervical back: Normal range of motion.     Right lower leg: No edema.     Left lower leg: No edema.  Lymphadenopathy:     Cervical: No cervical adenopathy.     Upper Body:     Right upper body: No supraclavicular adenopathy.     Left upper body: No supraclavicular adenopathy.  Skin:    General: Skin is warm and dry.  Neurological:     General: No focal deficit present.     Mental Status: She is alert and oriented to person, place, and time. Mental status is at baseline.     Sensory: Sensation is intact.     Motor: Motor function is intact. No weakness.     Deep Tendon Reflexes: Reflexes are normal and symmetric.  Psychiatric:        Attention and Perception: Attention normal.        Mood and Affect: Mood normal.        Speech: Speech normal.        Behavior: Behavior normal.        Thought Content: Thought content normal.        Cognition and Memory: Cognition normal.        Judgment: Judgment normal.     Current Outpatient Medications  Medication Instructions   atorvastatin  (LIPITOR) 10 mg, Oral, Daily   Coenzyme Q10 (CO Q-10) 300 MG CAPS Take by mouth.   levofloxacin  (LEVAQUIN ) 500 mg, Oral, Daily   Lomaira  8 mg, Oral, Daily   loratadine (CLARITIN) 10 mg, Daily   topiramate  (TOPAMAX ) 50 mg, Oral, Daily   Vibegron  (GEMTESA) 75 MG TABS Take by mouth.   Vitamin D3 5,000 Units, Oral, Daily   Zinc 50 MG TABS Take by mouth.   Past Medical History:  Diagnosis Date   Allergy    High cholesterol    Joint pain    Lactose intolerance    Microscopic hematuria    Migraine    Overactive bladder    SOB (shortness of breath)    Urinary incontinence    Medical/Surgical History Narrative:  Allergic/Intolerant to:  Allergies  Allergen Reactions   Penicillins Hives  Tizanidine  Other (See Comments)    headaches   Vicodin [Hydrocodone -Acetaminophen ]    Past Surgical History:  Procedure Laterality Date   BREAST BIOPSY Left 07/30/2022   MM LT BREAST BX W LOC DEV 1ST LESION IMAGE BX SPEC STEREO GUIDE 07/30/2022 [facility]   CATARACT EXTRACTION Bilateral 04/2021   CESAREAN SECTION     EYE SURGERY     Family History  Problem Relation Age of Onset   Thyroid  disease Mother    Hyperlipidemia Mother    Hypertension Mother    Heart disease Mother    Anxiety disorder Mother    Stroke Father        also alocholic   Alcoholism Father    Healthy Sister    Breast cancer Maternal Grandmother    Heart failure Paternal Grandfather    Colon cancer Neg Hx    Esophageal cancer Neg Hx    Pancreatic cancer Neg Hx    Rectal cancer Neg Hx    Stomach cancer Neg Hx   Social history: She retired from [employer] and is now working at [employer] part time. She is a widow.  She lost her husband to lung cancer in 2019 and her  adult son age 70, to a drug overdose in November 2021.  Sister resides with her since her husband passed away.  Family history: Mother deceased.  Father with history of stroke.  Social History   Social History Narrative   Caffeine  very little .  Lives at home alone.   One child.  [employer].  Works [employer].    Most Recent Health Risks Assessment:   Medicare Risk at Home - 10/28/23 0956     Any stairs in or around the home? Yes    If so, are there any without handrails?  No    Home free of loose throw rugs in walkways, pet beds, electrical cords, etc? Yes    Adequate lighting in your home to reduce risk of falls? Yes    Life alert? No    Use of a cane, walker or w/c? No    Grab bars in the bathroom? No    Shower chair or bench in shower? No    Elevated toilet seat or a handicapped toilet? No         Most Recent Social Determinants of Health (Including Hx of Tobacco, Alcohol, and Drug Use) SDOH Screenings   Food Insecurity: No Food Insecurity (10/28/2023)  Housing: Low Risk  (10/28/2023)  Transportation Needs: No Transportation Needs (10/15/2022)  Utilities: Not At Risk (10/15/2022)  Alcohol Screen: Low Risk  (10/28/2023)  Depression (PHQ2-9): Low Risk  (10/28/2023)  Financial Resource Strain: Low Risk  (10/28/2023)  Physical Activity: Insufficiently Active (10/28/2023)  Social Connections: Moderately Integrated (10/28/2023)  Stress: No Stress Concern Present (10/28/2023)  Tobacco Use: Medium Risk (10/28/2023)  Health Literacy: Adequate Health Literacy (10/15/2022)   Social History   Tobacco Use   Smoking status: Former    Current packs/day: 0.00    Types: Cigarettes    Quit date: 03/02/1993    Years since quitting: 30.6   Smokeless tobacco: Never  Vaping Use   Vaping status: Never Used  Substance Use Topics   Alcohol use: Yes    Alcohol/week: 4.0 standard drinks of alcohol    Types: 4 Standard drinks or equivalent per week   Drug use: No   Most Recent Functional Status Assessment:    10/28/2023    9:56 AM  In your present  state of health, do you have any difficulty performing the following activities:  Hearing? 0  Vision? 0  Difficulty concentrating or making decisions? 0  Walking or climbing stairs? 0  Dressing or bathing? 0  Doing errands, shopping? 0  Preparing Food and eating ? N  Using the Toilet? N  In the past six months, have you accidently leaked urine? N  Do you have problems with loss of bowel control? N  Managing your  Medications? N  Managing your Finances? N  Housekeeping or managing your Housekeeping? N   Most Recent Fall Risk Assessment:    10/28/2023    9:57 AM  Fall Risk   Falls in the past year? 0  Number falls in past yr: 0  Injury with Fall? 0  Risk for fall due to : No Fall Risks  Follow up Falls prevention discussed;Falls evaluation completed;Education provided   Most Recent Anxiety/Depression Screenings:    10/28/2023    9:57 AM 05/04/2023    9:14 AM  PHQ 2/9 Scores  PHQ - 2 Score 0 0  PHQ- 9 Score  0       No data to display         Most Recent Cognitive Screening:    10/28/2023   10:00 AM  6CIT Screen  What Year? 0 points  What month? 0 points  What time? 0 points  Count back from 20 0 points  Months in reverse 0 points  Repeat phrase 0 points  Total Score 0 points   Most Recent Vision/Hearing Screenings:No results found. Results:  Studies Obtained And Personally Reviewed By Me:  08/19/2023 Mammogram   IMPRESSION: No mammographic evidence of malignancy. A result letter of this screening mammogram will be mailed directly to the patient.   RECOMMENDATION: Screening mammogram in one year. (Code:[id number]-B-01Y)   BI-RADS CATEGORY  1: Negative.    Colonoscopy 12/22/2016  found the perianal and digital rectal exams were normal, entire examined colon apeared normal on direct and retroflexion views with repeat recommendation of 12/23/2026.  Bone Density 04/28/21  T-score at 0.2 was considered normal according to world health organization criteria.   Labs:  CBC w/ Differential Lab Results  Component Value Date   WBC 6.0 10/19/2023   RBC 4.40 10/19/2023   HGB 13.5 10/19/2023   HCT 41.6 10/19/2023   PLT 299 10/19/2023   MCV 94.5 10/19/2023   MCH 30.7 10/19/2023   MCHC 32.5 10/19/2023   RDW 12.5 10/19/2023   MPV 10.1 10/19/2023   LYMPHSABS 1.2 05/04/2023   MONOABS 0.5 05/27/2018   BASOSABS 18 10/19/2023    Comprehensive Metabolic Panel Lab Results  Component  Value Date   NA 142 10/19/2023   K 4.7 10/19/2023   CL 109 10/19/2023   CO2 27 10/19/2023   GLUCOSE 99 10/19/2023   BUN 12 10/19/2023   CREATININE 0.76 10/19/2023   CALCIUM  9.3 10/19/2023   PROT 6.1 10/19/2023   ALBUMIN 4.1 05/04/2023   AST 13 10/19/2023   ALT 9 10/19/2023   ALKPHOS 103 05/04/2023   BILITOT 0.5 10/19/2023   EGFR 86 05/04/2023   GFRNONAA 81 02/20/2019   Lipid Panel  Lab Results  Component Value Date   CHOL 190 10/19/2023   HDL 68 10/19/2023   LDLCALC 108 (H) 10/19/2023   TRIG 62 10/19/2023   A1c Lab Results  Component Value Date   HGBA1C 5.6 05/04/2023    TSH Lab Results  Component Value Date   TSH 0.89 10/19/2023  No results found for any visits on 10/28/23. Assessment & Plan:   Orders Placed This Encounter  Procedures   POCT URINALYSIS DIP (CLINITEK)   Meds ordered this encounter  Medications   levofloxacin  (LEVAQUIN ) 500 MG tablet    Sig: Take 1 tablet (500 mg total) by mouth daily for 7 days.    Dispense:  7 tablet    Refill:  0  Other Labs Reviewed today:   LDL: 108 otherwise labs WNL     Hypercholesterolemia: treated with Atorvastatin   10 mg. As of  10/15/2022 her CHOL elevated at 235 and LDL is elevated at 143, Up from 230 and 138. She has not been taking rosuvastatin  due to body aches she has had while taking it . Suggest Coenzyme Q daily which should help myalgia associated with statin med. Ordered coronary calcium  scoring in December 2024. This would help determine whether or not she needed to take a  statin medication  Migraine Headaches:  and has seen Dr. Ines in 2018   Mixed stress and urge urinary incontinence: seen by Dr. Keneth in April 2023 - for which she takes Gamtesa 75 mg daily.    History of lichen sclerosus seen by Dermatologist.      status post bilateral cataract extractions in  December 2022 and January 2023   epicondylitis of right elbow: Treated with anti-inflammatory medication in February 2023    Allergic rhinitis: managed by Claritin   Vitamin-D Deficiency: treated with Vitamin D3 daily by mouth.    Obesity: for which she takes Phentermine  8 mg and Topiramate  50 mg daily for weight loss. Recommended by the healthy weight and wellness clinic to eat  more protein, which she is working on.   Right Ankle Weakness: having weakness in her right ankle that makes it difficult to walk    Says she is going to make an appointment with the Gynecologist soon.    Hx of macular hole right eye  08/19/2023 Mammogram Negative.    Colonoscopy 12/22/2016  found the perianal and digital rectal exams were normal, entire examined colon apeared normal on direct and retroflexion views with repeat recommendation of 12/23/2026   Vaccine counseling : Covid, Influenza, PNA vaccines due. HBV vaccine discontinued     Travel Advise: Going to [place] soon, requesting prophylactic antibiotics in case she contacts an illness.  Sending in 500 mg Levaquin  - take 1 tablet daily with a meal for 7 days as prescribed in case she gets sick as she is traveling out of the country.  Return in 1 year (on 11/07/2024) for annual labs, and then on 11/14/2024 for annual visit, or as needed.   Annual Wellness Visit done today including the all of the following: Reviewed patient's Family Medical History Reviewed patient's SDOH and reviewed tobacco, alcohol, and drug use.  Reviewed and updated list of patient's medical providers Assessment of cognitive impairment was done Assessed patient's functional ability Established a written schedule for health screening services Health Risk Assessent Completed and Reviewed  Discussed health benefits of physical activity, and encouraged her to engage in regular exercise appropriate for her age and condition.   I,Emily Lagle,acting as a Neurosurgeon for Ronal JINNY Hailstone, MD.,have documented all relevant documentation on the behalf of Ronal JINNY Hailstone, MD,as directed by  Ronal JINNY Hailstone, MD while in the  presence of Ronal JINNY Hailstone, MD.  I, Ronal JINNY Hailstone, MD, have reviewed all documentation for and agree with the above Annual Wellness Visit documentation.  Ronal JINNY Hailstone,  MD Internal Medicine 10/28/2023

## 2023-10-26 ENCOUNTER — Ambulatory Visit: Payer: Medicare Other | Admitting: Internal Medicine

## 2023-10-26 DIAGNOSIS — M25571 Pain in right ankle and joints of right foot: Secondary | ICD-10-CM | POA: Diagnosis not present

## 2023-10-27 ENCOUNTER — Ambulatory Visit (INDEPENDENT_AMBULATORY_CARE_PROVIDER_SITE_OTHER): Admitting: Family Medicine

## 2023-10-27 ENCOUNTER — Encounter (INDEPENDENT_AMBULATORY_CARE_PROVIDER_SITE_OTHER): Payer: Self-pay | Admitting: Family Medicine

## 2023-10-27 VITALS — BP 93/64 | HR 81 | Temp 98.0°F | Ht 63.0 in | Wt 207.0 lb

## 2023-10-27 DIAGNOSIS — E669 Obesity, unspecified: Secondary | ICD-10-CM | POA: Diagnosis not present

## 2023-10-27 DIAGNOSIS — Z6836 Body mass index (BMI) 36.0-36.9, adult: Secondary | ICD-10-CM

## 2023-10-27 DIAGNOSIS — E678 Other specified hyperalimentation: Secondary | ICD-10-CM | POA: Diagnosis not present

## 2023-10-27 DIAGNOSIS — E7800 Pure hypercholesterolemia, unspecified: Secondary | ICD-10-CM

## 2023-10-27 MED ORDER — TOPIRAMATE 50 MG PO TABS: 50.0000 mg | ORAL_TABLET | Freq: Every day | ORAL | 0 refills | Status: DC

## 2023-10-27 MED ORDER — ATORVASTATIN CALCIUM 10 MG PO TABS: 10.0000 mg | ORAL_TABLET | Freq: Every day | ORAL | 0 refills | Status: DC

## 2023-10-27 MED ORDER — LOMAIRA 8 MG PO TABS: 8.0000 mg | ORAL_TABLET | Freq: Every day | ORAL | 0 refills | Status: DC

## 2023-10-27 NOTE — Progress Notes (Signed)
 SUBJECTIVE:  Chief Complaint: Obesity  Interim History: Patient has been trying to eat all the protein she can but she is just not hungry.  She has the food available but she cannot get enough food in.  She is having issues with calories and protein.  She has a Vitamix and she can drink the entire pitcher during the course of the day and has boiled eggs in the am.  By lunchtime she will have protein and apple.  By dinner she is not hungry. Total calorie wise she is ending around 1000 calories.  She will get protein chips later in the day.  Total protein is half of what goal is.  She is leaving next week to go to [place] so she is planning on just relaxing this weekend.  Teandra is here to discuss her progress with her obesity treatment plan. She is on the keeping a food journal and adhering to recommended goals of 1100-1200 calories and 90 grams of protein and states she is following her eating plan approximately 50 % of the time. She states she is not exercising as much due to ankle injury.   OBJECTIVE: Visit Diagnoses: Problem List Items Addressed This Visit       Other   Excessive carbohydrate intake - Primary    Starting weight of 215 so goal will be 5% by mid July which will be 10.5lb.  Weight today of 207.  PDMP checked today.  No side effects mentioned.  Will decrease lomaira  to 8mg  and topirmate to 50mg  daily to see if that allows patient to get total nutrition in.        Vitals Temp: 98 F (36.7 C) BP: 93/64 Pulse Rate: 81 SpO2: 100 %   Anthropometric Measurements Height: 5' 3 (1.6 m) Weight: 207 lb (93.9 kg) BMI (Calculated): 36.68 Weight at Last Visit: 207 lb Weight Lost Since Last Visit: 0 Weight Gained Since Last Visit: 0 Starting Weight: 214 lb Total Weight Loss (lbs): 7 lb (3.175 kg) Peak Weight: 214 lb   Body Composition  Body Fat %: 48.5 % Fat Mass (lbs): 100.4 lbs Muscle Mass (lbs): 101.2 lbs Total Body Water (lbs): 74.2 lbs Visceral Fat Rating :  15   Other Clinical Data Today's Visit #: 9 Starting Date: 05/04/23 Comments: 1100-1200/80     ASSESSMENT AND PLAN: Assessment & Plan Excessive carbohydrate intake  Starting weight of 215 so goal will be 5% by mid July which will be 10.5lb.  Weight today of 207.  PDMP checked today.  No side effects mentioned.  Will decrease lomaira  to 8mg  and topirmate to 50mg  daily to see if that allows patient to get total nutrition in.  Pure hypercholesterolemia LDL on August 19 of 108 with an HDL of 60 and triglycerides at 62.  Close to controlled with the exception of LDL.  Patient needs to continue on with nutrition modifications of less than 20% intake of saturated fat. Obesity with starting BMI of 37.9  BMI 36.0-36.9,adult    Diet: Charne is currently in the action stage of change. As such, her goal is to continue with weight loss efforts and has agreed to keeping a food journal and adhering to recommended goals of 1100-1200 calories and 90 or more grams of protein.   Exercise:  Older adults should determine their level of effort for physical activity relative to their level of fitness.  Behavior Modification:  We discussed the following Behavioral Modification Strategies today: increasing lean protein intake, decreasing simple carbohydrates,  increasing vegetables, no skipping meals, and meal planning and cooking strategies. We discussed various medication options to help Delaynee with her weight loss efforts and we both agreed to decrease phentermine  and topiramate  to 8mg  and 50mg  and re-evaluate patient's intake at next appointment.  Return in about 4 weeks (around 11/24/2023).   She was informed of the importance of frequent follow up visits to maximize her success with intensive lifestyle modifications for her multiple health conditions.  Attestation Statements:   Reviewed by clinician on day of visit: allergies, medications, problem list, medical history, surgical history, family  history, social history, and previous encounter notes.     [name], MD

## 2023-10-27 NOTE — Assessment & Plan Note (Addendum)
 Starting weight of 215 so goal will be 5% by mid July which will be 10.5lb.  Weight today of 207.  PDMP checked today.  No side effects mentioned.  Will decrease lomaira  to 8mg  and topirmate to 50mg  daily to see if that allows patient to get total nutrition in.

## 2023-10-28 ENCOUNTER — Encounter: Payer: Self-pay | Admitting: Internal Medicine

## 2023-10-28 ENCOUNTER — Ambulatory Visit: Payer: Medicare Other | Admitting: Internal Medicine

## 2023-10-28 VITALS — BP 120/70 | Ht 62.5 in | Wt 210.0 lb

## 2023-10-28 DIAGNOSIS — E7800 Pure hypercholesterolemia, unspecified: Secondary | ICD-10-CM | POA: Diagnosis not present

## 2023-10-28 DIAGNOSIS — Z0000 Encounter for general adult medical examination without abnormal findings: Secondary | ICD-10-CM | POA: Diagnosis not present

## 2023-10-28 DIAGNOSIS — L900 Lichen sclerosus et atrophicus: Secondary | ICD-10-CM

## 2023-10-28 DIAGNOSIS — Z8669 Personal history of other diseases of the nervous system and sense organs: Secondary | ICD-10-CM

## 2023-10-28 DIAGNOSIS — N6099 Unspecified benign mammary dysplasia of unspecified breast: Secondary | ICD-10-CM

## 2023-10-28 DIAGNOSIS — N3946 Mixed incontinence: Secondary | ICD-10-CM

## 2023-10-28 DIAGNOSIS — Z6837 Body mass index (BMI) 37.0-37.9, adult: Secondary | ICD-10-CM | POA: Diagnosis not present

## 2023-10-28 DIAGNOSIS — E559 Vitamin D deficiency, unspecified: Secondary | ICD-10-CM

## 2023-10-28 DIAGNOSIS — Z9842 Cataract extraction status, left eye: Secondary | ICD-10-CM

## 2023-10-28 DIAGNOSIS — N6019 Diffuse cystic mastopathy of unspecified breast: Secondary | ICD-10-CM

## 2023-10-28 DIAGNOSIS — Z9841 Cataract extraction status, right eye: Secondary | ICD-10-CM

## 2023-10-28 LAB — POCT URINALYSIS DIP (CLINITEK)
Bilirubin, UA: NEGATIVE
Blood, UA: NEGATIVE
Glucose, UA: NEGATIVE mg/dL
Ketones, POC UA: NEGATIVE mg/dL
Leukocytes, UA: NEGATIVE
Nitrite, UA: NEGATIVE
POC PROTEIN,UA: NEGATIVE
Spec Grav, UA: 1.015 (ref 1.010–1.025)
Urobilinogen, UA: 0.2 U/dL
pH, UA: 6.5 (ref 5.0–8.0)

## 2023-10-28 MED ORDER — LEVOFLOXACIN 500 MG PO TABS: 500.0000 mg | ORAL_TABLET | Freq: Every day | ORAL | 0 refills | Status: AC

## 2023-10-28 NOTE — Patient Instructions (Signed)
 Next appointment: Follow up in one year for your annual wellness visit    Preventive Care 70 Years and Older, Female Preventive care refers to lifestyle choices and visits with your health care provider that can promote health and wellness. What does preventive care include? A yearly physical exam. This is also called an annual well check. Dental exams once or twice a year. Routine eye exams. Ask your health care provider how often you should have your eyes checked. Personal lifestyle choices, including: Daily care of your teeth and gums. Regular physical activity. Eating a healthy diet. Avoiding tobacco and drug use. Limiting alcohol use. Practicing safe sex. Taking low-dose aspirin every day. Taking vitamin and mineral supplements as recommended by your health care provider. What happens during an annual well check? The services and screenings done by your health care provider during your annual well check will depend on your age, overall health, lifestyle risk factors, and family history of disease. Counseling  Your health care provider may ask you questions about your: Alcohol use. Tobacco use. Drug use. Emotional well-being. Home and relationship well-being. Sexual activity. Eating habits. History of falls. Memory and ability to understand (cognition). Work and work Astronomer. Reproductive health. Screening  You may have the following tests or measurements: Height, weight, and BMI. Blood pressure. Lipid and cholesterol levels. These may be checked every 5 years, or more frequently if you are over 70 years old. Skin check. Lung cancer screening. You may have this screening every year starting at age 70 if you have a 30-pack-year history of smoking and currently smoke or have quit within the past 15 years. Fecal occult blood test (FOBT) of the stool. You may have this test every year starting at age 70. Flexible sigmoidoscopy or colonoscopy. You may have a  sigmoidoscopy every 5 years or a colonoscopy every 10 years starting at age 70. Hepatitis C blood test. Hepatitis B blood test. Sexually transmitted disease (STD) testing. Diabetes screening. This is done by checking your blood sugar (glucose) after you have not eaten for a while (fasting). You may have this done every 1-3 years. Bone density scan. This is done to screen for osteoporosis. You may have this done starting at age 70. Mammogram. This may be done every 1-2 years. Talk to your health care provider about how often you should have regular mammograms. Talk with your health care provider about your test results, treatment options, and if necessary, the need for more tests. Vaccines  Your health care provider may recommend certain vaccines, such as: Influenza vaccine. This is recommended every year. Tetanus, diphtheria, and acellular pertussis (Tdap, Td) vaccine. You may need a Td booster every 10 years. Zoster vaccine. You may need this after age 70. Pneumococcal 13-valent conjugate (PCV13) vaccine. One dose is recommended after age 18. Pneumococcal polysaccharide (PPSV23) vaccine. One dose is recommended after age 25. Talk to your health care provider about which screenings and vaccines you need and how often you need them. This information is not intended to replace advice given to you by your health care provider. Make sure you discuss any questions you have with your health care provider. Document Released: 03/15/2015 Document Revised: 11/06/2015 Document Reviewed: 12/18/2014 Elsevier Interactive Patient Education  2017 [employer].  Fall Prevention in the Home Falls can cause injuries. They can happen to people of all ages. There are many things you can do to make your home safe and to help prevent falls. What can I do on the outside of  my home? Regularly fix the edges of walkways and driveways and fix any cracks. Remove anything that might make you trip as you walk through a  door, such as a raised step or threshold. Trim any bushes or trees on the path to your home. Use bright outdoor lighting. Clear any walking paths of anything that might make someone trip, such as rocks or tools. Regularly check to see if handrails are loose or broken. Make sure that both sides of any steps have handrails. Any raised decks and porches should have guardrails on the edges. Have any leaves, snow, or ice cleared regularly. Use sand or salt on walking paths during winter. Clean up any spills in your garage right away. This includes oil or grease spills. What can I do in the bathroom? Use night lights. Install grab bars by the toilet and in the tub and shower. Do not use towel bars as grab bars. Use non-skid mats or decals in the tub or shower. If you need to sit down in the shower, use a plastic, non-slip stool. Keep the floor dry. Clean up any water that spills on the floor as soon as it happens. Remove soap buildup in the tub or shower regularly. Attach bath mats securely with double-sided non-slip rug tape. Do not have throw rugs and other things on the floor that can make you trip. What can I do in the bedroom? Use night lights. Make sure that you have a light by your bed that is easy to reach. Do not use any sheets or blankets that are too big for your bed. They should not hang down onto the floor. Have a firm chair that has side arms. You can use this for support while you get dressed. Do not have throw rugs and other things on the floor that can make you trip. What can I do in the kitchen? Clean up any spills right away. Avoid walking on wet floors. Keep items that you use a lot in easy-to-reach places. If you need to reach something above you, use a strong step stool that has a grab bar. Keep electrical cords out of the way. Do not use floor polish or wax that makes floors slippery. If you must use wax, use non-skid floor wax. Do not have throw rugs and other things  on the floor that can make you trip. What can I do with my stairs? Do not leave any items on the stairs. Make sure that there are handrails on both sides of the stairs and use them. Fix handrails that are broken or loose. Make sure that handrails are as long as the stairways. Check any carpeting to make sure that it is firmly attached to the stairs. Fix any carpet that is loose or worn. Avoid having throw rugs at the top or bottom of the stairs. If you do have throw rugs, attach them to the floor with carpet tape. Make sure that you have a light switch at the top of the stairs and the bottom of the stairs. If you do not have them, ask someone to add them for you. What else can I do to help prevent falls? Wear shoes that: Do not have high heels. Have rubber bottoms. Are comfortable and fit you well. Are closed at the toe. Do not wear sandals. If you use a stepladder: Make sure that it is fully opened. Do not climb a closed stepladder. Make sure that both sides of the stepladder are locked into place. Ask  someone to hold it for you, if possible. Clearly mark and make sure that you can see: Any grab bars or handrails. First and last steps. Where the edge of each step is. Use tools that help you move around (mobility aids) if they are needed. These include: Canes. Walkers. Scooters. Crutches. Turn on the lights when you go into a dark area. Replace any light bulbs as soon as they burn out. Set up your furniture so you have a clear path. Avoid moving your furniture around. If any of your floors are uneven, fix them. If there are any pets around you, be aware of where they are. Review your medicines with your doctor. Some medicines can make you feel dizzy. This can increase your chance of falling. Ask your doctor what other things that you can do to help prevent falls. This information is not intended to replace advice given to you by your health care provider. Make sure you discuss any  questions you have with your health care provider. Document Released: 12/13/2008 Document Revised: 07/25/2015 Document Reviewed: 03/23/2014 Elsevier Interactive Patient Education  2017 [employer].

## 2023-10-28 NOTE — Progress Notes (Signed)
 Pharmacy Quality Measure Review  This patient is appearing on a report for being at risk of failing the adherence measure for cholesterol (statin) medications this calendar year.   Medication: atorvastatin  10 mg daily Last fill date: 10/27/2023 for 90 day supply  Insurance report was not up to date. No action needed at this time.   Woodie Jock, PharmD PGY1 Pharmacy Resident

## 2023-10-28 NOTE — Progress Notes (Signed)
 Subjective:   Brandi Berry is a 68 y.o. female who presents for Medicare Annual (Subsequent) preventive examination.  Visit Complete: In person  Patient Medicare AWV questionnaire was completed by the patient on 10/28/2023; I have confirmed that all information answered by patient is correct and no changes since this date.  Cardiac Risk Factors include: advanced age (>59men, >44 women);dyslipidemia;obesity (BMI >30kg/m2)     Objective:    Today's Vitals   10/28/23 0950 10/28/23 1025  BP: 120/70   Weight: 210 lb (95.3 kg)   Height: 5' 2.5 (1.588 m)   PainSc: 5  5   PainLoc: Ankle    Body mass index is 37.8 kg/m.     10/28/2023    9:57 AM 10/15/2022    9:55 AM 08/19/2021    9:42 AM 05/27/2018    9:57 PM 12/08/2016    3:46 PM  Advanced Directives  Does Patient Have a Medical Advance Directive? No No No No  No   Does patient want to make changes to medical advance directive?  No - Patient declined     Would patient like information on creating a medical advance directive? No - Patient declined No - Patient declined Yes (MAU/Ambulatory/Procedural Areas - Information given) No - Patient declined       Data saved with a previous flowsheet row definition    Current Medications (verified) Outpatient Encounter Medications as of 10/28/2023  Medication Sig   levofloxacin  (LEVAQUIN ) 500 MG tablet Take 1 tablet (500 mg total) by mouth daily for 7 days.   atorvastatin  (LIPITOR) 10 MG tablet Take 1 tablet (10 mg total) by mouth daily.   Cholecalciferol (VITAMIN D3) 125 MCG (5000 UT) CAPS Take 1 capsule (5,000 Units total) by mouth daily.   Coenzyme Q10 (CO Q-10) 300 MG CAPS Take by mouth.   loratadine (CLARITIN) 10 MG tablet Take 10 mg by mouth daily.   Phentermine  HCl (LOMAIRA ) 8 MG TABS Take 1 tablet (8 mg total) by mouth daily.   topiramate  (TOPAMAX ) 50 MG tablet Take 1 tablet (50 mg total) by mouth daily.   Vibegron (GEMTESA) 75 MG TABS Take by mouth.   Zinc 50 MG TABS  Take by mouth.   [DISCONTINUED] atorvastatin  (LIPITOR) 10 MG tablet Take 1 tablet (10 mg total) by mouth daily.   [DISCONTINUED] Phentermine  HCl (LOMAIRA ) 8 MG TABS Take 1.5 tablets (12 mg total) by mouth daily.   [DISCONTINUED] topiramate  (TOPAMAX ) 50 MG tablet Take 1.5 tablets (75 mg total) by mouth daily.   No facility-administered encounter medications on file as of 10/28/2023.    Allergies (verified) Penicillins, Tizanidine , and Vicodin [hydrocodone -acetaminophen ]   History: Past Medical History:  Diagnosis Date   Allergy    High cholesterol    Joint pain    Lactose intolerance    Microscopic hematuria    Migraine    Overactive bladder    SOB (shortness of breath)    Urinary incontinence    Past Surgical History:  Procedure Laterality Date   BREAST BIOPSY Left 07/30/2022   MM LT BREAST BX W LOC DEV 1ST LESION IMAGE BX SPEC STEREO GUIDE 07/30/2022 [facility]   CATARACT EXTRACTION Bilateral 04/2021   CESAREAN SECTION     EYE SURGERY     Family History  Problem Relation Age of Onset   Thyroid  disease Mother    Hyperlipidemia Mother    Hypertension Mother    Heart disease Mother    Anxiety disorder Mother    Stroke Father  also alocholic   Alcoholism Father    Healthy Sister    Breast cancer Maternal Grandmother    Heart failure Paternal Grandfather    Colon cancer Neg Hx    Esophageal cancer Neg Hx    Pancreatic cancer Neg Hx    Rectal cancer Neg Hx    Stomach cancer Neg Hx    Social History   Socioeconomic History   Marital status: Widowed    Spouse name: Not on file   Number of children: 1   Years of education: Not on file   Highest education level: Master's degree (e.g., MA, MS, MEng, MEd, MSW, MBA)  Occupational History   Occupation: Retired  Tobacco Use   Smoking status: Former    Current packs/day: 0.00    Types: Cigarettes    Quit date: 03/02/1993    Years since quitting: 30.6   Smokeless tobacco: Never  Vaping Use   Vaping  status: Never Used  Substance and Sexual Activity   Alcohol use: Yes    Alcohol/week: 4.0 standard drinks of alcohol    Types: 4 Standard drinks or equivalent per week   Drug use: No   Sexual activity: Not on file  Other Topics Concern   Not on file  Social History Narrative   Caffeine  very little .  Lives at home alone.   One child.  [employer].  Works [employer].    Social Drivers of Corporate investment banker Strain: Low Risk  (10/28/2023)   Overall Financial Resource Strain (CARDIA)    Difficulty of Paying Living Expenses: Not hard at all  Food Insecurity: No Food Insecurity (10/28/2023)   Hunger Vital Sign    Worried About Running Out of Food in the Last Year: Never true    Ran Out of Food in the Last Year: Never true  Transportation Needs: No Transportation Needs (10/15/2022)   PRAPARE - Administrator, Civil Service (Medical): No    Lack of Transportation (Non-Medical): No  Physical Activity: Insufficiently Active (10/28/2023)   Exercise Vital Sign    Days of Exercise per Week: 2 days    Minutes of Exercise per Session: 30 min  Stress: No Stress Concern Present (10/28/2023)   [employer] of Occupational Health - Occupational Stress Questionnaire    Feeling of Stress: Not at all  Social Connections: Moderately Integrated (10/28/2023)   Social Connection and Isolation Panel    Frequency of Communication with Friends and Family: More than three times a week    Frequency of Social Gatherings with Friends and Family: Once a week    Attends Religious Services: 1 to 4 times per year    Active Member of [employer] or Organizations: Yes    Attends Banker Meetings: 1 to 4 times per year    Marital Status: Widowed    Tobacco Counseling Counseling given: No   Clinical Intake:  Pre-visit preparation completed: Yes  Pain : 0-10 Pain Score: 5  Pain Location: Ankle Pain Orientation: Right Pain Descriptors / Indicators: Aching Pain Onset: 1 to  4 weeks ago Pain Frequency: Intermittent Pain Relieving Factors: Ankle brace  Pain Relieving Factors: Ankle brace  BMI - recorded: 37.8 Nutritional Status: BMI > 30  Obese Nutritional Risks: None Diabetes: No  How often do you need to have someone help you when you read instructions, pamphlets, or other written materials from your doctor or pharmacy?: 1 - Never  Interpreter Needed?: No  Information entered by :: Langford Carias  [place], CMA   Activities of Daily Living    10/28/2023    9:56 AM  In your present state of health, do you have any difficulty performing the following activities:  Hearing? 0  Vision? 0  Difficulty concentrating or making decisions? 0  Walking or climbing stairs? 0  Dressing or bathing? 0  Doing errands, shopping? 0  Preparing Food and eating ? N  Using the Toilet? N  In the past six months, have you accidently leaked urine? N  Do you have problems with loss of bowel control? N  Managing your Medications? N  Managing your Finances? N  Housekeeping or managing your Housekeeping? N    Patient Care Team: Perri Ronal PARAS, MD as PCP - General (Internal Medicine)  Indicate any recent Medical Services you may have received from other than [facility] providers in the past year (date may be approximate).     Assessment:   This is a routine wellness examination for Caci.  Depression Screen    10/28/2023    9:57 AM 05/04/2023    9:14 AM 10/15/2022    9:49 AM 04/28/2021    3:01 PM 02/28/2019   11:20 AM 02/28/2019   11:14 AM 10/19/2017   11:14 AM  PHQ 2/9 Scores  PHQ - 2 Score 0 0 0 0 0 0 0  PHQ- 9 Score  0         Fall Risk    10/28/2023    9:57 AM 10/15/2022    9:55 AM 10/15/2022    9:49 AM 08/19/2021    9:43 AM 04/28/2021    3:01 PM  Fall Risk   Falls in the past year? 0 0 0 0 0  Number falls in past yr: 0 0 0 0 0  Injury with Fall? 0 0 0 0 0  Risk for fall due to : No Fall Risks No Fall Risks No Fall Risks No Fall Risks No Fall Risks  Follow up  Falls prevention discussed;Falls evaluation completed;Education provided Falls evaluation completed Falls evaluation completed Falls evaluation completed  Falls evaluation completed      Data saved with a previous flowsheet row definition    MEDICARE RISK AT HOME: Medicare Risk at Home Any stairs in or around the home?: Yes If so, are there any without handrails?: No Home free of loose throw rugs in walkways, pet beds, electrical cords, etc?: Yes Adequate lighting in your home to reduce risk of falls?: Yes Life alert?: No Use of a cane, walker or w/c?: No Grab bars in the bathroom?: No Shower chair or bench in shower?: No Elevated toilet seat or a handicapped toilet?: No  TIMED UP AND GO:  Was the test performed?  No    Cognitive Function:        10/28/2023   10:00 AM 10/15/2022    9:52 AM 08/19/2021    9:44 AM  6CIT Screen  What Year? 0 points 0 points 0 points  What month? 0 points 0 points 0 points  What time? 0 points 0 points 0 points  Count back from 20 0 points 0 points 0 points  Months in reverse 0 points 0 points 0 points  Repeat phrase 0 points 0 points 0 points  Total Score 0 points 0 points 0 points    Immunizations Immunization History  Administered Date(s) Administered   Influenza,inj,Quad PF,6+ Mos 11/25/2018   Influenza-Unspecified 12/30/2020   PFIZER(Purple Top)SARS-COV-2 Vaccination 04/25/2019, 05/16/2019, 12/30/2019, 10/08/2020, 12/11/2020   Pneumococcal Conjugate-13 12/08/2018  Tdap 02/27/2019, 02/28/2019   Zoster Recombinant(Shingrix) 11/14/2019, 07/16/2020    TDAP status: Up to date  Flu Vaccine status: Due, Education has been provided regarding the importance of this vaccine. Advised may receive this vaccine at local pharmacy or Health Dept. Aware to provide a copy of the vaccination record if obtained from local pharmacy or Health Dept. Verbalized acceptance and understanding.  Pneumococcal vaccine status: Due, Education has been provided  regarding the importance of this vaccine. Advised may receive this vaccine at local pharmacy or Health Dept. Aware to provide a copy of the vaccination record if obtained from local pharmacy or Health Dept. Verbalized acceptance and understanding.  Covid-19 vaccine status: Information provided on how to obtain vaccines.   Qualifies for Shingles Vaccine? Yes   Zostavax completed No   Shingrix Completed?: Yes  Screening Tests Health Maintenance  Topic Date Due   INFLUENZA VACCINE  10/01/2023   COVID-19 Vaccine (6 - 2024-25 season) 11/13/2023 (Originally 11/01/2022)   Pneumococcal Vaccine: 50+ Years (2 of 2 - PCV20 or PCV21) 10/27/2024 (Originally 12/08/2019)   Medicare Annual Wellness (AWV)  10/18/2024   MAMMOGRAM  08/17/2025   Colonoscopy  12/23/2026   DTaP/Tdap/Td (3 - Td or Tdap) 02/27/2029   DEXA SCAN  Completed   Zoster Vaccines- Shingrix  Completed   HPV VACCINES  Aged Out   Meningococcal B Vaccine  Aged Out   Hepatitis C Screening  Discontinued    Health Maintenance  Health Maintenance Due  Topic Date Due   INFLUENZA VACCINE  10/01/2023    Colorectal cancer screening: Type of screening: Colonoscopy. Completed 12/22/2016. Repeat every 10 years  Mammogram status: Completed 08/18/2023. Repeat every year  Bone Density status: Completed 11/24/2021. Results reflect: Bone density results: NORMAL. Repeat every 3 years.  Lung Cancer Screening: (Low Dose CT Chest recommended if Age 71-80 years, 20 pack-year currently smoking OR have quit w/in 15years.) does not qualify.   Lung Cancer Screening Referral:   Additional Screening:  Hepatitis C Screening: does not qualify; Completed   Vision Screening: Recommended annual ophthalmology exams for early detection of glaucoma and other disorders of the eye. Is the patient up to date with their annual eye exam?  Yes  Who is the provider or what is the name of the office in which the patient attends annual eye exams? [facility] If pt  is not established with a provider, would they like to be referred to a provider to establish care? No .   Dental Screening: Recommended annual dental exams for proper oral hygiene  Community Resource Referral / Chronic Care Management: CRR required this visit?  No   CCM required this visit?  No     Plan:     I have personally reviewed and noted the following in the patient's chart:   Medical and social history Use of alcohol, tobacco or illicit drugs  Current medications and supplements including opioid prescriptions. Patient is not currently taking opioid prescriptions. Functional ability and status Nutritional status Physical activity Advanced directives List of other physicians Hospitalizations, surgeries, and ER visits in previous 12 months Vitals Screenings to include cognitive, depression, and falls Referrals and appointments  In addition, I have reviewed and discussed with patient certain preventive protocols, quality metrics, and best practice recommendations. A written personalized care plan for preventive services as well as general preventive health recommendations were provided to patient.     Bryahna Lesko Zelda, CMA   10/28/2023   After Visit Summary: (In Person-Printed) AVS printed and  given to the patient  I, Ronal JINNY Hailstone, MD, have reviewed all documentation for this visit. The documentation on 10/28/2023 for the exam, diagnosis, procedures, and orders are all accurate and complete.   I have reviewed and agree with the above Annual Wellness Visit documentation.  Ronal Norleen Hailstone, MD. Internal Medicine 10/28/2023

## 2023-10-29 DIAGNOSIS — M76821 Posterior tibial tendinitis, right leg: Secondary | ICD-10-CM | POA: Diagnosis not present

## 2023-10-29 DIAGNOSIS — M25571 Pain in right ankle and joints of right foot: Secondary | ICD-10-CM | POA: Diagnosis not present

## 2023-11-03 NOTE — Assessment & Plan Note (Signed)
 LDL on August 19 of 108 with an HDL of 60 and triglycerides at 62.  Close to controlled with the exception of LDL.  Patient needs to continue on with nutrition modifications of less than 20% intake of saturated fat.

## 2023-11-04 ENCOUNTER — Telehealth (INDEPENDENT_AMBULATORY_CARE_PROVIDER_SITE_OTHER): Payer: Self-pay

## 2023-11-04 NOTE — Telephone Encounter (Signed)
 PA for Lomaira  has been denied.   [name] [address] Hours of Operation: Address: 5 a.m. - 10 p.m. PT, Monday-Friday [address] [place], [place] [address] 6 a.m. - 3 p.m. PT, Saturday [place], [place] [address] Date: 10/28/2023 To: [name] From: [facility] Phone: [phone] Phone: [phone] Fax: [phone] Reference #: [id number]  RE: Prior Authorization Request __________________________________________________________________________________ Patient Name: Brandi Berry Patient DOB: Sep 15, 1955 Patient ID: [id number] Status of Request: Deny Medication Name: Lomaira  Tab 8mg  GPI/NDC: [id number] Decision Notes: Drugs, when used for anorexia, weight loss or weight gain, are excluded from coverage under Medicare rules. Please refer to your Evidence of Coverage (EOC) document that details your Medicare prescription drug coverage. If the treating physician would like to discuss this coverage decision with the physician or health care professional reviewer, please call [facility] Prior Authorization department at [phone].

## 2023-11-24 ENCOUNTER — Ambulatory Visit (INDEPENDENT_AMBULATORY_CARE_PROVIDER_SITE_OTHER): Admitting: Family Medicine

## 2023-11-25 ENCOUNTER — Ambulatory Visit (INDEPENDENT_AMBULATORY_CARE_PROVIDER_SITE_OTHER): Admitting: Family Medicine

## 2023-11-25 ENCOUNTER — Encounter (INDEPENDENT_AMBULATORY_CARE_PROVIDER_SITE_OTHER): Payer: Self-pay | Admitting: Family Medicine

## 2023-11-25 VITALS — BP 123/73 | HR 67 | Temp 98.2°F | Ht 63.0 in | Wt 205.0 lb

## 2023-11-25 DIAGNOSIS — E678 Other specified hyperalimentation: Secondary | ICD-10-CM

## 2023-11-25 DIAGNOSIS — E669 Obesity, unspecified: Secondary | ICD-10-CM

## 2023-11-25 DIAGNOSIS — Z6836 Body mass index (BMI) 36.0-36.9, adult: Secondary | ICD-10-CM | POA: Diagnosis not present

## 2023-11-25 MED ORDER — TOPIRAMATE 50 MG PO TABS: 75.0000 mg | ORAL_TABLET | Freq: Every day | ORAL | 0 refills | Status: DC

## 2023-11-25 MED ORDER — LOMAIRA 8 MG PO TABS
12.0000 mg | ORAL_TABLET | Freq: Every day | ORAL | 0 refills | Status: DC
Start: 2023-11-25 — End: 2023-12-21

## 2023-11-25 NOTE — Progress Notes (Signed)
 SUBJECTIVE:  Chief Complaint: Obesity  Interim History: Patient here for follow up and she just got back from [place].  She walked so much and she is very grateful she was wearing [employer]'s.  She twisted her ankle and mentions she has been nursing it since her trip. She is trying to get back into eating protein and getting consistent on being back on plan.  She took her medication with her and was in control of the quantity of the food she was taking in while away    Brandi Berry is here to discuss her progress with her obesity treatment plan. She is on the Category 2 Plan and states she is following her eating plan approximately 50 % of the time. She states she is not exercising 0 minutes 0 times per week.   OBJECTIVE: Visit Diagnoses: Problem List Items Addressed This Visit       Other   Excessive carbohydrate intake - Primary   Relevant Medications   Phentermine  HCl (LOMAIRA ) 8 MG TABS   topiramate  (TOPAMAX ) 50 MG tablet   Morbid obesity (HCC)   Relevant Medications   Phentermine  HCl (LOMAIRA ) 8 MG TABS   Other Visit Diagnoses       BMI 36.0-36.9,adult           Vitals Temp: 98.2 F (36.8 C) BP: 123/73 Pulse Rate: 67 SpO2: 97 %   Anthropometric Measurements Height: 5' 3 (1.6 m) Weight: 205 lb (93 kg) BMI (Calculated): 36.32 Weight at Last Visit: 207lb Weight Lost Since Last Visit: 2lb Weight Gained Since Last Visit: 0lb Starting Weight: 214lb Total Weight Loss (lbs): 9 lb (4.082 kg) Peak Weight: 214lb   Body Composition  Body Fat %: 48.5 % Fat Mass (lbs): 99.8 lbs Muscle Mass (lbs): 100.4 lbs Total Body Water (lbs): 76.2 lbs Visceral Fat Rating : 15   Other Clinical Data Fasting: No Labs: no Today's Visit #: 10 Starting Date: 05/04/23     ASSESSMENT AND PLAN: Assessment & Plan Excessive carbohydrate intake Patient have achieved 5% weight loss- starting weight of 215 and now at 205.  PDMP checked and no concerns noted.  Patient needs a refill of  medication today and is still working on total intake.  Will increase dose as below. Obesity with starting BMI of 38.4  BMI 36.0-36.9,adult    Diet: Brandi Berry is currently in the action stage of change. As such, her goal is to continue with weight loss efforts and has agreed to the Category 2 Plan, keeping a food journal and adhering to recommended goals of 1100-1200 calories and 85 or more grams of protein, and the Pescatarian Plan.   Exercise:  Older adults should determine their level of effort for physical activity relative to their level of fitness.  Behavior Modification:  We discussed the following Behavioral Modification Strategies today: increasing lean protein intake, decreasing simple carbohydrates, increasing vegetables, meal planning and cooking strategies, keeping healthy foods in the home, planning for success, and keep a strict food journal. We discussed various medication options to help Brandi Berry with her weight loss efforts and we both agreed to increase phentermine  and topiramate  to 12mg  phentermine  and 75mg  topiramate  daily.  Return in about 4 weeks (around 12/23/2023).   She was informed of the importance of frequent follow up visits to maximize her success with intensive lifestyle modifications for her multiple health conditions.  Attestation Statements:   Reviewed by clinician on day of visit: allergies, medications, problem list, medical history, surgical history, family history, social history,  and previous encounter notes.    [name], MD

## 2023-11-29 NOTE — Assessment & Plan Note (Signed)
 Patient have achieved 5% weight loss- starting weight of 215 and now at 205.  PDMP checked and no concerns noted.  Patient needs a refill of medication today and is still working on total intake.  Will increase dose as below.

## 2023-12-07 ENCOUNTER — Encounter: Payer: Self-pay | Admitting: Internal Medicine

## 2023-12-07 ENCOUNTER — Ambulatory Visit (INDEPENDENT_AMBULATORY_CARE_PROVIDER_SITE_OTHER): Admitting: Internal Medicine

## 2023-12-07 ENCOUNTER — Ambulatory Visit: Payer: Self-pay | Admitting: Internal Medicine

## 2023-12-07 VITALS — BP 110/80 | HR 77 | Temp 99.1°F | Ht 63.0 in | Wt 205.0 lb

## 2023-12-07 DIAGNOSIS — R6883 Chills (without fever): Secondary | ICD-10-CM

## 2023-12-07 DIAGNOSIS — R0989 Other specified symptoms and signs involving the circulatory and respiratory systems: Secondary | ICD-10-CM

## 2023-12-07 DIAGNOSIS — J069 Acute upper respiratory infection, unspecified: Secondary | ICD-10-CM

## 2023-12-07 LAB — POC COVID19/FLU A&B COMBO
Covid Antigen, POC: NEGATIVE
Influenza A Antigen, POC: NEGATIVE
Influenza B Antigen, POC: NEGATIVE

## 2023-12-07 MED ORDER — AZITHROMYCIN 250 MG PO TABS: ORAL_TABLET | ORAL | 0 refills | Status: AC

## 2023-12-07 NOTE — Progress Notes (Addendum)
 Patient Care Team: Perri Ronal PARAS, MD as PCP - General (Internal Medicine)  Visit Date: 12/07/23  Subjective:    Patient ID: Brandi Berry , Female   DOB: 12-02-1955, 68 y.o.    MRN: [id number]   68 y.o. Female presents today for  respiratory infection symptoms. Patient has a past medical history of Hypertension, Hyperglycemia, Migraine headaches, Vitamin D  deficiency.    She said she has felt ill  for about a week. She says that she has Fever, chills and runny nose, congestion. She also said that she felt like she had fluid in her ears. She has been taking Mucinex but it was done nothing to alleviate her symptoms. Influenza and Covid-19 tests were negative today.   Past Medical History:  Diagnosis Date   Allergy    High cholesterol    Joint pain    Lactose intolerance    Microscopic hematuria    Migraine    Overactive bladder    SOB (shortness of breath)    Urinary incontinence      Family History  Problem Relation Age of Onset   Thyroid  disease Mother    Hyperlipidemia Mother    Hypertension Mother    Heart disease Mother    Anxiety disorder Mother    Stroke Father        also alocholic   Alcoholism Father    Healthy Sister    Breast cancer Maternal Grandmother    Heart failure Paternal Grandfather    Colon cancer Neg Hx    Esophageal cancer Neg Hx    Pancreatic cancer Neg Hx    Rectal cancer Neg Hx    Stomach cancer Neg Hx     Social Hx: Resides alone. She is a widow. Only son is deceased.     Review of Systems  Constitutional:  Positive for chills and fever.  HENT:  Positive for congestion.   Respiratory:  Positive for sputum production. Negative for cough.         Objective:   Vitals: BP 110/80   Pulse 77   Temp 99.1 F (37.3 C)   Ht 5' 3 (1.6 m)   Wt 205 lb (93 kg)   SpO2 95%   BMI 36.31 kg/m    Physical Exam Vitals and nursing note reviewed.  Constitutional:      General: She is not in acute distress.    Appearance:  Normal appearance. She is not ill-appearing.  HENT:     Head: Normocephalic and atraumatic.     Right Ear: Tympanic membrane, ear canal and external ear normal.     Left Ear: Tympanic membrane, ear canal and external ear normal.     Mouth/Throat:     Mouth: Mucous membranes are moist.     Pharynx: Oropharynx is clear. No oropharyngeal exudate or posterior oropharyngeal erythema.     Comments: Pharynx slightly injected  Pulmonary:     Effort: Pulmonary effort is normal.     Breath sounds: Normal breath sounds. No wheezing, rhonchi or rales.  Lymphadenopathy:     Cervical: No cervical adenopathy.  Skin:    General: Skin is warm and dry.  Neurological:     Mental Status: She is alert and oriented to person, place, and time. Mental status is at baseline.  Psychiatric:        Mood and Affect: Mood normal.        Behavior: Behavior normal.        Thought Content: Thought content  normal.        Judgment: Judgment normal.       Results:     Labs:       Component Value Date/Time   NA 142 10/19/2023 0000   NA 141 05/04/2023 1030   K 4.7 10/19/2023 0000   CL 109 10/19/2023 0000   CO2 27 10/19/2023 0000   GLUCOSE 99 10/19/2023 0000   BUN 12 10/19/2023 0000   BUN 13 05/04/2023 1030   CREATININE 0.76 10/19/2023 0000   CALCIUM  9.3 10/19/2023 0000   PROT 6.1 10/19/2023 0000   PROT 6.7 05/04/2023 1030   ALBUMIN 4.1 05/04/2023 1030   AST 13 10/19/2023 0000   ALT 9 10/19/2023 0000   ALKPHOS 103 05/04/2023 1030   BILITOT 0.5 10/19/2023 0000   BILITOT 0.7 05/04/2023 1030   GFRNONAA 81 02/20/2019 0935   GFRAA 94 02/20/2019 0935     Lab Results  Component Value Date   WBC 6.0 10/19/2023   HGB 13.5 10/19/2023   HCT 41.6 10/19/2023   MCV 94.5 10/19/2023   PLT 299 10/19/2023    Lab Results  Component Value Date   CHOL 190 10/19/2023   HDL 68 10/19/2023   LDLCALC 108 (H) 10/19/2023   TRIG 62 10/19/2023   CHOLHDL 2.8 10/19/2023    Lab Results  Component Value Date    HGBA1C 5.6 05/04/2023     Lab Results  Component Value Date   TSH 0.89 10/19/2023      Assessment & Plan:   Acute upper respiratory infection: She says she has felt ill for about a week. She says that she has a fever, chills  runny nose, and respiratory congestion. She also said that she felt like she had fluid in her ears. She has been taking Mucinex but it was done nothing to alleviate her symptoms. Influenza and Covid-19 tests were negative.   Azithromycin  250 mg 2 tablets on day one, one tablet on days 2-5 prescribed.   Recommended to buy OTC Chlor-Trimeton for nasal congestion/rhinorrhea   I,Makayla C Reid,acting as a scribe for Ronal JINNY Hailstone, MD.,have documented all relevant documentation on the behalf of Ronal JINNY Hailstone, MD,as directed by  Ronal JINNY Hailstone, MD while in the presence of Ronal JINNY Hailstone, MD.   I, Ronal JINNY Hailstone, MD, have reviewed all documentation for this visit. The documentation on 12/07/2023 for the exam, diagnosis, procedures, and orders are all accurate and complete.

## 2023-12-18 NOTE — Patient Instructions (Signed)
 Covid and rapid flu tests are negative. Please take Zithromax  Z pak 2 tabs day 1 followed by one tab days 2-5. May take Chlortrimeton for nasal congestion/ runny nose. Rest and stay well hydrated. Call if not better in 7 days or sooner if worse.

## 2023-12-21 ENCOUNTER — Ambulatory Visit (INDEPENDENT_AMBULATORY_CARE_PROVIDER_SITE_OTHER): Admitting: Family Medicine

## 2023-12-21 VITALS — BP 110/72 | HR 61 | Temp 98.2°F | Ht 63.0 in | Wt 202.0 lb

## 2023-12-21 DIAGNOSIS — E7800 Pure hypercholesterolemia, unspecified: Secondary | ICD-10-CM | POA: Diagnosis not present

## 2023-12-21 DIAGNOSIS — E669 Obesity, unspecified: Secondary | ICD-10-CM

## 2023-12-21 DIAGNOSIS — E678 Other specified hyperalimentation: Secondary | ICD-10-CM | POA: Diagnosis not present

## 2023-12-21 DIAGNOSIS — Z6835 Body mass index (BMI) 35.0-35.9, adult: Secondary | ICD-10-CM

## 2023-12-21 MED ORDER — PHENTERMINE HCL 8 MG PO TABS: 12.0000 mg | ORAL_TABLET | Freq: Every day | ORAL | 0 refills | Status: DC

## 2023-12-21 MED ORDER — TOPIRAMATE 50 MG PO TABS
75.0000 mg | ORAL_TABLET | Freq: Every day | ORAL | 0 refills | Status: DC
Start: 2023-12-21 — End: 2024-01-20

## 2023-12-21 NOTE — Progress Notes (Signed)
 SUBJECTIVE:  Chief Complaint: Obesity  Interim History: Since last appointment patient has been working a bit and trying to incorporate a bit more walking.  She is trying to eat more protein but is struggling to get everything in. She is only able to get 50% of total intake goal in.  She is trying to focus to get nutrition in.  Normally she will have a smoothie in the am and a couple boiled eggs.  Normally if she doesn't make a smoothie she will drink a fairlife drink.  She will do a dinner of tuna and pasta combo and an apple for lunch.  Otherwise she may eat salmon and vegetables and will eat everything.  Over the next few weeks she is planning to celebrate a girlfriends birthday.  Not sure what her plans will be for Thanksgiving yet.   Brandi Berry is here to discuss her progress with her obesity treatment plan. She is on the Category 2 Plan and states she is following her eating plan approximately 50 % of the time. She states she is walking more- 2-3 days a week.   OBJECTIVE: Visit Diagnoses: Problem List Items Addressed This Visit       Other   Pure hypercholesterolemia - Primary   Excessive carbohydrate intake   Relevant Medications   topiramate  (TOPAMAX ) 50 MG tablet   Phentermine  HCl (LOMAIRA ) 8 MG TABS   Morbid obesity (HCC)   Relevant Medications   Phentermine  HCl (LOMAIRA ) 8 MG TABS   Other Visit Diagnoses       BMI 35.0-35.9,adult           Vitals Temp: 98.2 F (36.8 C) BP: 110/72 Pulse Rate: 61 SpO2: 100 %   Anthropometric Measurements Height: 5' 3 (1.6 m) Weight: 202 lb (91.6 kg) BMI (Calculated): 35.79 Weight at Last Visit: 205 lb Weight Lost Since Last Visit: 3 Weight Gained Since Last Visit: 0 Starting Weight: 214 lb Total Weight Loss (lbs): 12 lb (5.443 kg)   Body Composition  Body Fat %: 48 % Fat Mass (lbs): 97.2 lbs Muscle Mass (lbs): 99.8 lbs Total Body Water (lbs): 71.8 lbs Visceral Fat Rating : 15   Other Clinical Data Fasting:  yes Labs: no Today's Visit #: 11 Starting Date: 05/04/23 Comments: Cat 2     ASSESSMENT AND PLAN: Assessment & Plan Pure hypercholesterolemia Most recent labs in August still showing elevated LDL cholesterol.  Patient is going to continue to work on lifestyle changes and dietary modifications at this time.  Will need repeat labs with primary care or here in January 2026. Excessive carbohydrate intake Tolerating current doses of phentermine  and topiramate  with good results.  No side effects reported such as palpitations, taste discrepancies, brain fog, or mood changes.  PDMP checked with no concerns.  Will refill current dose of phentermine  and topiramate  for 1 month and reevaluate at next appointment as to whether or not patient can schedule 2 months out if she has been able to maintain 5% loss. Obesity with starting BMI of 38.4  BMI 35.0-35.9,adult    Diet: Brandi Berry is currently in the action stage of change. As such, her goal is to continue with weight loss efforts and has agreed to keeping a food journal and adhering to recommended goals of 1100-1200 calories and 85 or more grams of protein or Category 2 plan.   Exercise:  Older adults should determine their level of effort for physical activity relative to their level of fitness. and Older adults with chronic conditions  should understand whether and how their conditions affect their ability to do regular physical activity safely.  Behavior Modification:  We discussed the following Behavioral Modification Strategies today: increasing lean protein intake, decreasing simple carbohydrates, increasing vegetables, keeping healthy foods in the home, and better snacking choices. We discussed various medication options to help Brandi Berry with her weight loss efforts and we both agreed to continue current dose of medications of lomaira  and topiramate .  Return in about 4 weeks (around 01/18/2024).   She was informed of the importance of frequent  follow up visits to maximize her success with intensive lifestyle modifications for her multiple health conditions.  Attestation Statements:   Reviewed by clinician on day of visit: allergies, medications, problem list, medical history, surgical history, family history, social history, and previous encounter notes.     Brandi Cho, MD

## 2023-12-27 ENCOUNTER — Telehealth (INDEPENDENT_AMBULATORY_CARE_PROVIDER_SITE_OTHER): Payer: Self-pay | Admitting: Family Medicine

## 2023-12-27 NOTE — Telephone Encounter (Signed)
 Good morning!  Patient is requesting that her refill be sent to [facility]. She says that she hasn't received a notification as to whether it was sent last week  Thanks!

## 2023-12-29 NOTE — Assessment & Plan Note (Signed)
 Tolerating current doses of phentermine  and topiramate  with good results.  No side effects reported such as palpitations, taste discrepancies, brain fog, or mood changes.  PDMP checked with no concerns.  Will refill current dose of phentermine  and topiramate  for 1 month and reevaluate at next appointment as to whether or not patient can schedule 2 months out if she has been able to maintain 5% loss.

## 2023-12-29 NOTE — Assessment & Plan Note (Signed)
 Most recent labs in August still showing elevated LDL cholesterol.  Patient is going to continue to work on lifestyle changes and dietary modifications at this time.  Will need repeat labs with primary care or here in January 2026.

## 2024-01-20 ENCOUNTER — Ambulatory Visit (INDEPENDENT_AMBULATORY_CARE_PROVIDER_SITE_OTHER): Payer: Self-pay | Admitting: Family Medicine

## 2024-01-20 ENCOUNTER — Encounter (INDEPENDENT_AMBULATORY_CARE_PROVIDER_SITE_OTHER): Payer: Self-pay | Admitting: Family Medicine

## 2024-01-20 VITALS — BP 100/61 | HR 67 | Temp 97.8°F | Ht 63.0 in | Wt 202.0 lb

## 2024-01-20 DIAGNOSIS — E678 Other specified hyperalimentation: Secondary | ICD-10-CM

## 2024-01-20 DIAGNOSIS — E7800 Pure hypercholesterolemia, unspecified: Secondary | ICD-10-CM | POA: Diagnosis not present

## 2024-01-20 DIAGNOSIS — Z6835 Body mass index (BMI) 35.0-35.9, adult: Secondary | ICD-10-CM

## 2024-01-20 DIAGNOSIS — E669 Obesity, unspecified: Secondary | ICD-10-CM

## 2024-01-20 MED ORDER — ATORVASTATIN CALCIUM 10 MG PO TABS: 10.0000 mg | ORAL_TABLET | Freq: Every day | ORAL | 0 refills | Status: AC

## 2024-01-20 MED ORDER — TOPIRAMATE 50 MG PO TABS: 75.0000 mg | ORAL_TABLET | Freq: Every day | ORAL | 0 refills | Status: DC

## 2024-01-20 MED ORDER — PHENTERMINE HCL 8 MG PO TABS: 12.0000 mg | ORAL_TABLET | Freq: Every day | ORAL | 0 refills | Status: DC

## 2024-01-20 NOTE — Progress Notes (Signed)
 SUBJECTIVE:  Chief Complaint: Obesity  Interim History: Patient voices that following the plan has been very difficult.  When she starts eating she gets so full she cannot eat everything.  She feels like she is forcing herself to eat.  She will come back and eat then gets busy and forgets to eat.  She is going to a friend's house for Thanksgiving. She is eating a little at a time. She is eating less red meat than she was previously and is eating mostly chicken and fish. She is looking for recipe suggestions.   Brandi Berry is here to discuss her progress with her obesity treatment plan. She is on the keeping a food journal and adhering to recommended goals of 1100-1200 calories and 85 grams of protein and states she is following her eating plan approximately 50 % of the time. She states she is exercising 20 minutes 3 times per week.   OBJECTIVE: Visit Diagnoses: Problem List Items Addressed This Visit       Other   Pure hypercholesterolemia   Relevant Medications   atorvastatin  (LIPITOR) 10 MG tablet   Excessive carbohydrate intake - Primary   Relevant Medications   Phentermine  HCl (LOMAIRA ) 8 MG TABS   topiramate  (TOPAMAX ) 50 MG tablet   Morbid obesity (HCC)   Relevant Medications   Phentermine  HCl (LOMAIRA ) 8 MG TABS   Other Visit Diagnoses       BMI 35.0-35.9,adult           Vitals Temp: 97.8 F (36.6 C) BP: 100/61 Pulse Rate: 67 SpO2: 99 %   Anthropometric Measurements Height: 5' 3 (1.6 m) Weight: 202 lb (91.6 kg) BMI (Calculated): 35.79 Weight at Last Visit: 202 lb Weight Lost Since Last Visit: 0 Weight Gained Since Last Visit: 0 Starting Weight: 214 lb Total Weight Loss (lbs): 12 lb (5.443 kg)   Body Composition  Body Fat %: 47.9 % Fat Mass (lbs): 96.8 lbs Muscle Mass (lbs): 99.8 lbs Total Body Water (lbs): 71.2 lbs Visceral Fat Rating : 15   Other Clinical Data Today's Visit #: 12 Starting Date: 05/04/23 Comments: 1100-1200/85     ASSESSMENT  AND PLAN: Assessment & Plan Excessive carbohydrate intake Tolerating phentermine  and topiramate  at 12 and 75 mg daily.  Patient however is still struggling to obtain total daily calorie and protein goal intake.  Will alternate days between 8 mg phentermine  and 50 mg topiramate  with alternative days being 12 mg and 75.  Follow-up at next appointment to ensure adequate intake. Pure hypercholesterolemia Tolerating atorvastatin  10 mg daily.  Recent LDL down to 108.  Still not at goal will need continuation of medication and will increase dose if LDL at next lab draw is still elevated. BMI 35.0-35.9,adult  Obesity with starting BMI of 38.4    Diet: Brandi Berry is currently in the action stage of change. As such, her goal is to continue with weight loss efforts and has agreed to keeping a food journal and adhering to recommended goals of 1100-1200 calories and 85 or more grams of protein daily.   Exercise:  Older adults should determine their level of effort for physical activity relative to their level of fitness.  Behavior Modification:  We discussed the following Behavioral Modification Strategies today: increasing lean protein intake, decreasing simple carbohydrates, increasing vegetables, meal planning and cooking strategies, planning for success, and keep a strict food journal. We discussed various medication options to help Brandi Berry with her weight loss efforts and we both agreed to alternate phentermine  and  topiramate  doses to 8/50 every other day to 12/75.  Return in about 4 weeks (around 02/17/2024).   She was informed of the importance of frequent follow up visits to maximize her success with intensive lifestyle modifications for her multiple health conditions.  Attestation Statements:   Reviewed by clinician on day of visit: allergies, medications, problem list, medical history, surgical history, family history, social history, and previous encounter notes.     Brandi Cho, MD

## 2024-01-30 NOTE — Assessment & Plan Note (Signed)
 Tolerating atorvastatin  10 mg daily.  Recent LDL down to 108.  Still not at goal will need continuation of medication and will increase dose if LDL at next lab draw is still elevated.

## 2024-01-30 NOTE — Assessment & Plan Note (Signed)
 Tolerating phentermine  and topiramate  at 12 and 75 mg daily.  Patient however is still struggling to obtain total daily calorie and protein goal intake.  Will alternate days between 8 mg phentermine  and 50 mg topiramate  with alternative days being 12 mg and 75.  Follow-up at next appointment to ensure adequate intake.

## 2024-02-17 ENCOUNTER — Ambulatory Visit (INDEPENDENT_AMBULATORY_CARE_PROVIDER_SITE_OTHER): Payer: Self-pay | Admitting: Family Medicine

## 2024-02-17 VITALS — BP 110/61 | HR 71 | Temp 97.9°F | Ht 63.0 in | Wt 202.0 lb

## 2024-02-17 DIAGNOSIS — Z6835 Body mass index (BMI) 35.0-35.9, adult: Secondary | ICD-10-CM

## 2024-02-17 DIAGNOSIS — E559 Vitamin D deficiency, unspecified: Secondary | ICD-10-CM

## 2024-02-17 DIAGNOSIS — E669 Obesity, unspecified: Secondary | ICD-10-CM | POA: Diagnosis not present

## 2024-02-17 DIAGNOSIS — E678 Other specified hyperalimentation: Secondary | ICD-10-CM | POA: Diagnosis not present

## 2024-02-17 MED ORDER — TOPIRAMATE 50 MG PO TABS: 75.0000 mg | ORAL_TABLET | Freq: Every day | ORAL | 0 refills | Status: DC

## 2024-02-17 MED ORDER — PHENTERMINE HCL 8 MG PO TABS: 12.0000 mg | ORAL_TABLET | Freq: Every day | ORAL | 0 refills | Status: DC

## 2024-02-17 NOTE — Progress Notes (Signed)
 "  SUBJECTIVE:  Chief Complaint: Obesity  Interim History: patient here for follow up.  She mentions Thanksgiving was difficult due to the quantity of food available to her.  She tried to taste a little bit at a time.  She tried to be modest in her approach to eating.  She focused on eating protein first and making sure she is getting her nutrition in.  For the next few weeks she is busy- baking, working.  She will be local for the holiday.  She mentions journaling tends to be easiest for her to stay consistent with and keeps her aware of her intake.   Brandi Berry is here to discuss her progress with her obesity treatment plan. She is on the keeping a food journal and adhering to recommended goals of 1100-1200 calories and 85 grams of protein and states she is following her eating plan approximately 50 % of the time. She states she is walking- realizes she has not been walking the quantity she needs to.   OBJECTIVE: Visit Diagnoses: Problem List Items Addressed This Visit       Other   Vitamin D  deficiency   Excessive carbohydrate intake - Primary   Relevant Medications   Phentermine  HCl (LOMAIRA ) 8 MG TABS   topiramate  (TOPAMAX ) 50 MG tablet   Morbid obesity (HCC)   Relevant Medications   Phentermine  HCl (LOMAIRA ) 8 MG TABS   Other Visit Diagnoses       BMI 35.0-35.9,adult           Vitals Temp: 97.9 F (36.6 C) BP: 110/61 Pulse Rate: 71 SpO2: 100 %   Anthropometric Measurements Height: 5' 3 (1.6 m) Weight: 202 lb (91.6 kg) BMI (Calculated): 35.79 Weight at Last Visit: 202 lb Weight Lost Since Last Visit: 0 Weight Gained Since Last Visit: 0 Starting Weight: 214 lb Total Weight Loss (lbs): 12 lb (5.443 kg)   Body Composition  Body Fat %: 47.9 % Fat Mass (lbs): 97 lbs Muscle Mass (lbs): 100.2 lbs Total Body Water (lbs): 73 lbs Visceral Fat Rating : 15   Other Clinical Data Today's Visit #: 13 Starting Date: 05/04/23 Comments: 1100-1200/85     ASSESSMENT  AND PLAN: Assessment & Plan Excessive carbohydrate intake Patient has tolerated phentermine  and topiramate  for assistance in managing her carbohydrate intake.  She is still alternating dosages between 8 mg phentermine  and 50 mg topiramate  with 12 mg phentermine  and 75 mg topiramate  to ensure adequate intake.  She is feeling comfortable with this as she has into the holiday season.  Started on this makeshift Qsymia in April 2025 at 215 pounds.  Current weight is 202 pounds.  Patient has lost 5% of her body weight.  PDMP checked with no concerns. Vitamin D  deficiency Last vitamin D  level below goal at 37.  She has been on vitamin D  supplementation since that first appointment with us .  Will need repeat labs at next appointment to assess response to supplementation. BMI 35.0-35.9,adult  Obesity with starting BMI of 38.4    Diet: Brandi Berry is currently in the action stage of change. As such, her goal is to continue with weight loss efforts and has agreed to keeping a food journal and adhering to recommended goals of 1100-1200 calories and 85 or more grams of protein daily.   Exercise:  Older adults should determine their level of effort for physical activity relative to their level of fitness.  Patient is considering joining a gym to get more consistent activity in- may get [employer]  to do silver sneakers.  Behavior Modification:  We discussed the following Behavioral Modification Strategies today: increasing lean protein intake, decreasing simple carbohydrates, meal planning and cooking strategies, planning for success, and keep a strict food journal. We discussed various medication options to help Brandi Berry with her weight loss efforts and we both agreed to continue phentermine  and topiramate  at current doses.  Return in about 5 weeks (around 03/23/2024).   She was informed of the importance of frequent follow up visits to maximize her success with intensive lifestyle modifications for her multiple health  conditions.  Attestation Statements:   Reviewed by clinician on day of visit: allergies, medications, problem list, medical history, surgical history, family history, social history, and previous encounter notes.  [name], MD "

## 2024-02-28 NOTE — Assessment & Plan Note (Signed)
 Last vitamin D  level below goal at 37.  She has been on vitamin D  supplementation since that first appointment with us .  Will need repeat labs at next appointment to assess response to supplementation.

## 2024-02-28 NOTE — Assessment & Plan Note (Signed)
 Patient has tolerated phentermine  and topiramate  for assistance in managing her carbohydrate intake.  She is still alternating dosages between 8 mg phentermine  and 50 mg topiramate  with 12 mg phentermine  and 75 mg topiramate  to ensure adequate intake.  She is feeling comfortable with this as she has into the holiday season.  Started on this makeshift Qsymia in April 2025 at 215 pounds.  Current weight is 202 pounds.  Patient has lost 5% of her body weight.  PDMP checked with no concerns.

## 2024-03-21 ENCOUNTER — Ambulatory Visit (INDEPENDENT_AMBULATORY_CARE_PROVIDER_SITE_OTHER): Admitting: Family Medicine

## 2024-03-21 ENCOUNTER — Encounter (INDEPENDENT_AMBULATORY_CARE_PROVIDER_SITE_OTHER): Payer: Self-pay | Admitting: Family Medicine

## 2024-03-21 VITALS — BP 114/74 | HR 64 | Temp 98.3°F | Ht 63.0 in | Wt 203.0 lb

## 2024-03-21 DIAGNOSIS — E7800 Pure hypercholesterolemia, unspecified: Secondary | ICD-10-CM

## 2024-03-21 DIAGNOSIS — Z6835 Body mass index (BMI) 35.0-35.9, adult: Secondary | ICD-10-CM | POA: Diagnosis not present

## 2024-03-21 DIAGNOSIS — E678 Other specified hyperalimentation: Secondary | ICD-10-CM | POA: Diagnosis not present

## 2024-03-21 DIAGNOSIS — E669 Obesity, unspecified: Secondary | ICD-10-CM | POA: Diagnosis not present

## 2024-03-21 MED ORDER — TOPIRAMATE 50 MG PO TABS: 75.0000 mg | ORAL_TABLET | Freq: Every day | ORAL | 0 refills | Status: AC

## 2024-03-21 MED ORDER — PHENTERMINE HCL 8 MG PO TABS: 12.0000 mg | ORAL_TABLET | Freq: Every day | ORAL | 0 refills | Status: AC

## 2024-03-21 NOTE — Progress Notes (Signed)
 "  SUBJECTIVE:  Chief Complaint: Obesity  Interim History: Patient had hectic holidays and is happy the season is over. Patient has been trying different things to increase her total protein intake.  She is interested in finding ways to increase the protein without increasing quantity of food.  She is still getting full quickly.  She does find if she eats a significant amount of food in the am she will be not hungry most of the rest of the day. She will eat small amounts later in the day but won't be able to get much else in.   Brandi Berry is here to discuss her progress with her obesity treatment plan. She is on the keeping a food journal and adhering to recommended goals of 1100-1200 calories and 85 grams of protein and states she is following her eating plan approximately 50 % of the time. She states she is walking at work.  OBJECTIVE: Visit Diagnoses: Problem List Items Addressed This Visit       Other   Pure hypercholesterolemia   Excessive carbohydrate intake - Primary   Relevant Medications   Phentermine  HCl (LOMAIRA ) 8 MG TABS   topiramate  (TOPAMAX ) 50 MG tablet   Morbid obesity (HCC)   Relevant Medications   Phentermine  HCl (LOMAIRA ) 8 MG TABS   Other Visit Diagnoses       BMI 35.0-35.9,adult           Vitals Temp: 98.3 F (36.8 C) BP: 114/74 Pulse Rate: 64 SpO2: 98 %   Anthropometric Measurements Height: 5' 3 (1.6 m) Weight: 203 lb (92.1 kg) BMI (Calculated): 35.97 Weight at Last Visit: 202 lb Weight Lost Since Last Visit: 0 Weight Gained Since Last Visit: 1 Starting Weight: 214 lb Total Weight Loss (lbs): 11 lb (4.99 kg)   Body Composition  Body Fat %: 48.4 % Fat Mass (lbs): 98.4 lbs Muscle Mass (lbs): 99.4 lbs Total Body Water (lbs): 72.4 lbs Visceral Fat Rating : 15   Other Clinical Data Today's Visit #: 14 Starting Date: 05/04/23 Comments: 1100-1200/85     ASSESSMENT AND PLAN: Assessment & Plan Excessive carbohydrate intake Starting  weight 215 pounds.  Current weight 203 pounds which is greater than 5% loss.  PDMP checked.  Patient reports difficulty in obtaining intake goals of calories and protein.  She was given the option to alternate dosages between 8 mg phentermine  and 50 mg topiramate  with 12 mg phentermine  and 75 mg topiramate .  Patient was encouraged to go down to 8 and 50 consistently if still not able to get total intake goals . Pure hypercholesterolemia Working on limiting saturated fats to less than 20% of her total daily intake which she is able to achieve.  Follow-up on repeat labs in February. BMI 35.0-35.9,adult  Obesity with starting BMI of 38.4    Diet: Brandi Berry is currently in the action stage of change. As such, her goal is to continue with weight loss efforts and has agreed to keeping a food journal and adhering to recommended goals of 1100-1200 calories and 80 or more grams protein daily   Exercise:  Older adults should determine their level of effort for physical activity relative to their level of fitness.  Behavior Modification:  We discussed the following Behavioral Modification Strategies today: increasing lean protein intake, decreasing simple carbohydrates, increasing vegetables, meal planning and cooking strategies, and planning for success. We discussed various medication options to help Brandi Berry with her weight loss efforts and we both agreed to decrease lomaira  to 8mg  daily  and topiramate  50mg  daily.  Return in about 4 weeks (around 04/18/2024).   She was informed of the importance of frequent follow up visits to maximize her success with intensive lifestyle modifications for her multiple health conditions.  Attestation Statements:   Reviewed by clinician on day of visit: allergies, medications, problem list, medical history, surgical history, family history, social history, and previous encounter notes.     Brandi Cho, MD "

## 2024-03-29 NOTE — Assessment & Plan Note (Signed)
 Starting weight 215 pounds.  Current weight 203 pounds which is greater than 5% loss.  PDMP checked.  Patient reports difficulty in obtaining intake goals of calories and protein.  She was given the option to alternate dosages between 8 mg phentermine  and 50 mg topiramate  with 12 mg phentermine  and 75 mg topiramate .  Patient was encouraged to go down to 8 and 50 consistently if still not able to get total intake goals .

## 2024-03-29 NOTE — Assessment & Plan Note (Signed)
 Working on limiting saturated fats to less than 20% of her total daily intake which she is able to achieve.  Follow-up on repeat labs in February.

## 2024-04-25 ENCOUNTER — Ambulatory Visit (INDEPENDENT_AMBULATORY_CARE_PROVIDER_SITE_OTHER): Admitting: Family Medicine

## 2024-05-24 ENCOUNTER — Ambulatory Visit (INDEPENDENT_AMBULATORY_CARE_PROVIDER_SITE_OTHER): Admitting: Family Medicine

## 2024-11-07 ENCOUNTER — Other Ambulatory Visit: Payer: Self-pay

## 2024-11-14 ENCOUNTER — Ambulatory Visit: Payer: Self-pay | Admitting: Internal Medicine
